# Patient Record
Sex: Female | Born: 1990 | Race: Black or African American | Hispanic: No | Marital: Single | Smoking: Former smoker
Health system: Southern US, Community
[De-identification: ages and names within clinical notes are randomized; demographics above are authoritative.]

## PROBLEM LIST (undated history)

## (undated) DIAGNOSIS — Z789 Other specified health status: Secondary | ICD-10-CM

## (undated) DIAGNOSIS — D649 Anemia, unspecified: Secondary | ICD-10-CM

## (undated) HISTORY — PX: NO PAST SURGERIES: SHX2092

## (undated) HISTORY — DX: Anemia, unspecified: D64.9

## (undated) HISTORY — PX: WISDOM TOOTH EXTRACTION: SHX21

---
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
## 2012-01-16 ENCOUNTER — Encounter (HOSPITAL_COMMUNITY): Payer: Self-pay | Admitting: *Deleted

## 2012-01-16 ENCOUNTER — Emergency Department (HOSPITAL_COMMUNITY)
Admission: EM | Admit: 2012-01-16 | Discharge: 2012-01-16 | Disposition: A | Discharge status: Home / Self Care | Payer: Self-pay | Attending: Emergency Medicine | Admitting: Emergency Medicine

## 2012-01-16 ENCOUNTER — Emergency Department (HOSPITAL_COMMUNITY): Payer: Self-pay

## 2012-01-16 DIAGNOSIS — Y9389 Activity, other specified: Secondary | ICD-10-CM | POA: Insufficient documentation

## 2012-01-16 DIAGNOSIS — S9030XA Contusion of unspecified foot, initial encounter: Secondary | ICD-10-CM | POA: Insufficient documentation

## 2012-01-16 DIAGNOSIS — IMO0002 Reserved for concepts with insufficient information to code with codable children: Secondary | ICD-10-CM | POA: Insufficient documentation

## 2012-01-16 DIAGNOSIS — Y92410 Unspecified street and highway as the place of occurrence of the external cause: Secondary | ICD-10-CM | POA: Insufficient documentation

## 2012-01-16 DIAGNOSIS — F17200 Nicotine dependence, unspecified, uncomplicated: Secondary | ICD-10-CM | POA: Insufficient documentation

## 2012-01-16 MED ORDER — TRAMADOL HCL 50 MG PO TABS: 50.0000 mg | ORAL_TABLET | Freq: Four times a day (QID) | ORAL | Status: DC | PRN
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
## 2012-01-16 NOTE — ED Notes (Signed)
Patient given discharge instructions, information, prescriptions, and diet order. Patient states that they adequately understand discharge information given and to return to ED if symptoms return or worsen.     
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
## 2012-01-17 NOTE — ED Provider Notes (Signed)
Medical screening examination/treatment/procedure(s) were performed by non-physician practitioner and as supervising physician I was immediately available for consultation/collaboration.    Lexxie Winberg R Jaselyn Nahm, MD 01/17/12 0006 

## 2012-09-19 IMAGING — CT CT ABD-PELV W/O CM
1 of 2 series · 15 of 32 positions shown, 19 images · non-contrast
Comparison: None.

CLINICAL DATA: Right flank pain and dizziness.

CT ABDOMEN AND PELVIS WITHOUT CONTRAST
TECHNIQUE: Multidetector CT imaging of the abdomen and pelvis was
performed following the standard protocol without intravenous
contrast.

[Series 2: stone 130 5.0 b31f st · axial · 0.54mm/px · z∈[-424,-34]mm · 15 of 86 slices shown, 19 images]
[im 4/86  soft-tissue]
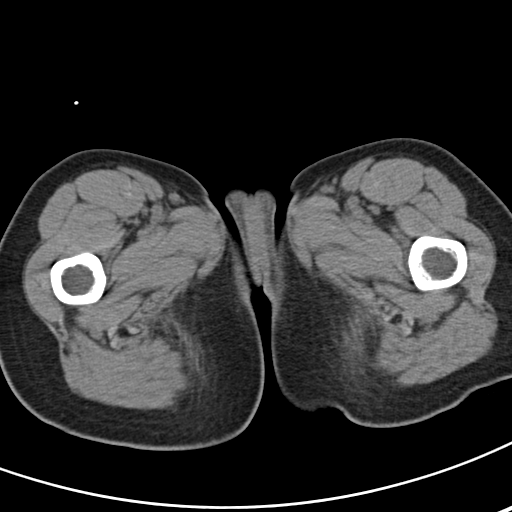
[im 4/86  bone]
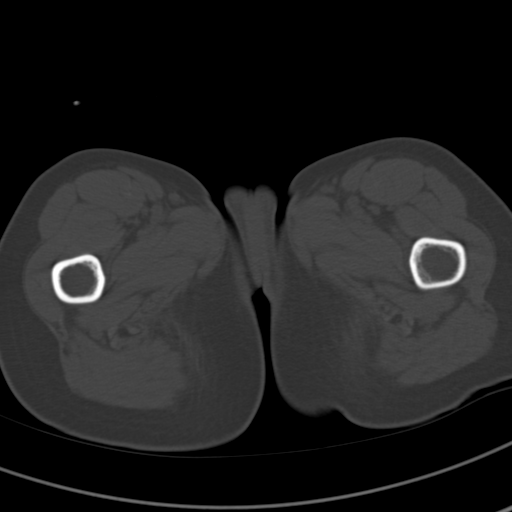
[im 11/86  soft-tissue]
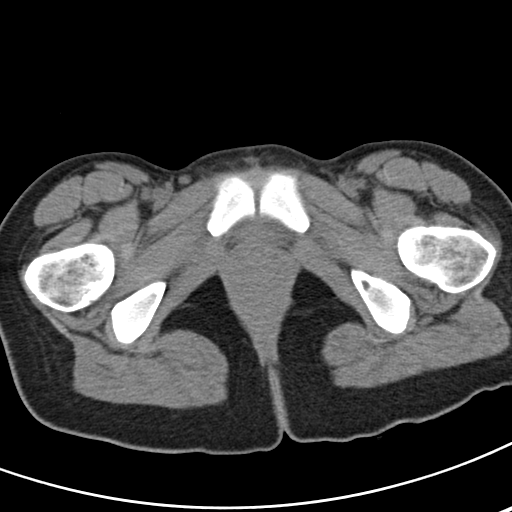
[im 18/86  soft-tissue]
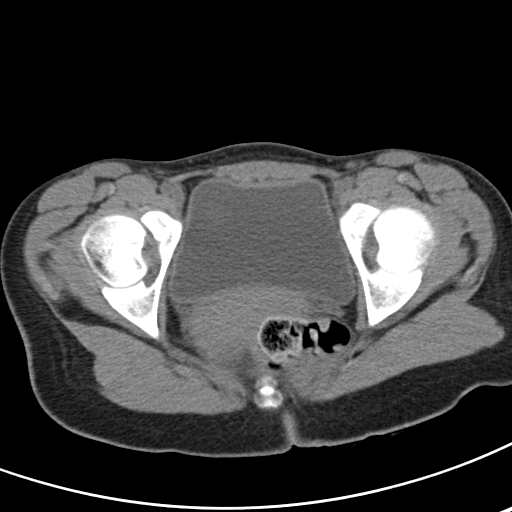
[im 24/86  soft-tissue]
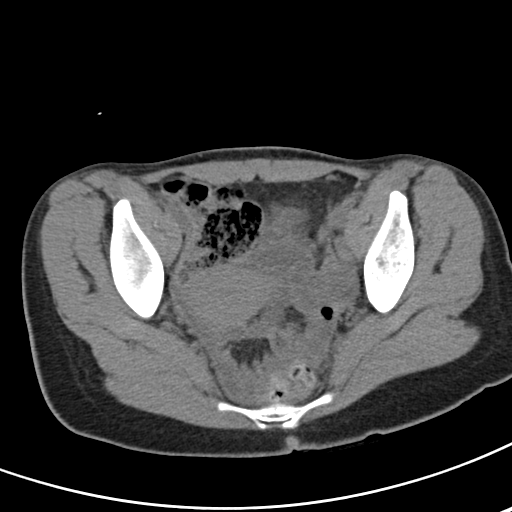
[im 31/86  soft-tissue]
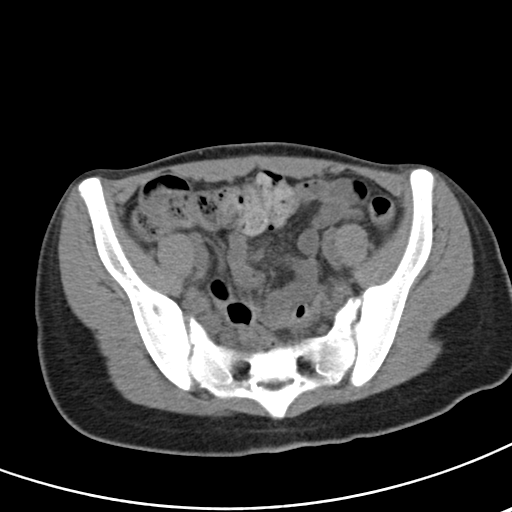
[im 38/86  soft-tissue]
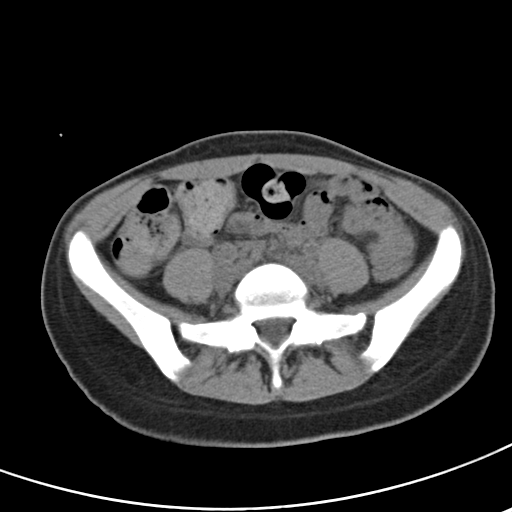
[im 45/86  soft-tissue]
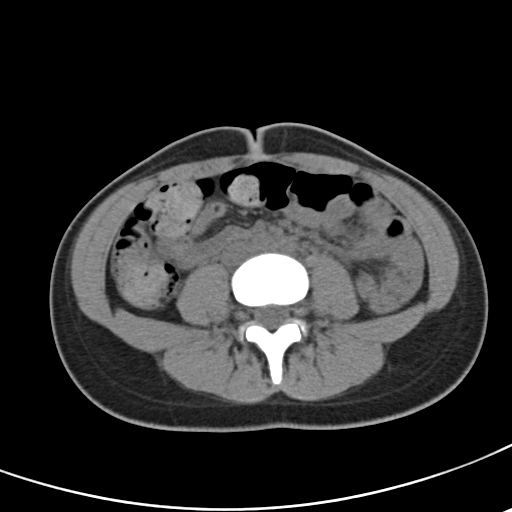
[im 48/86  soft-tissue]
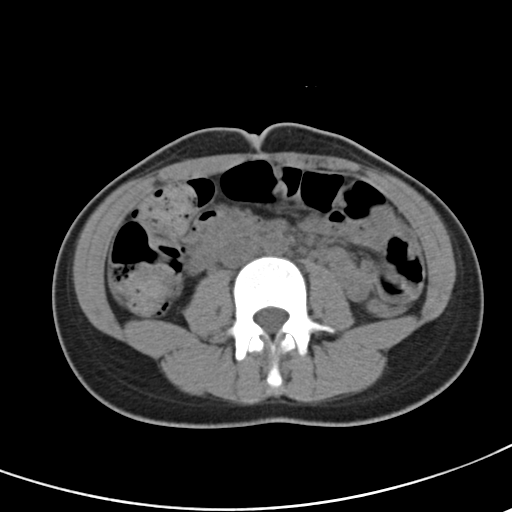
[im 55/86  soft-tissue]
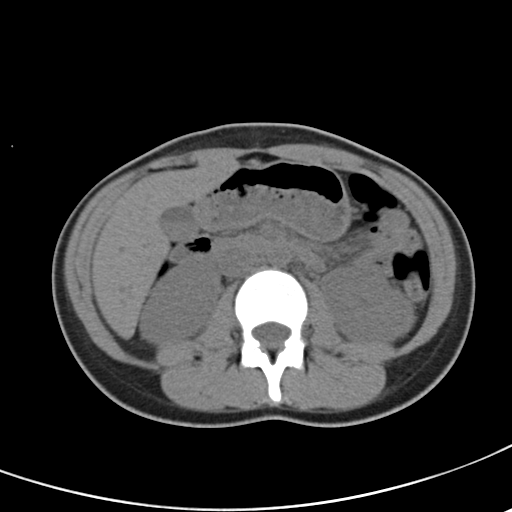
[im 55/86  bone]
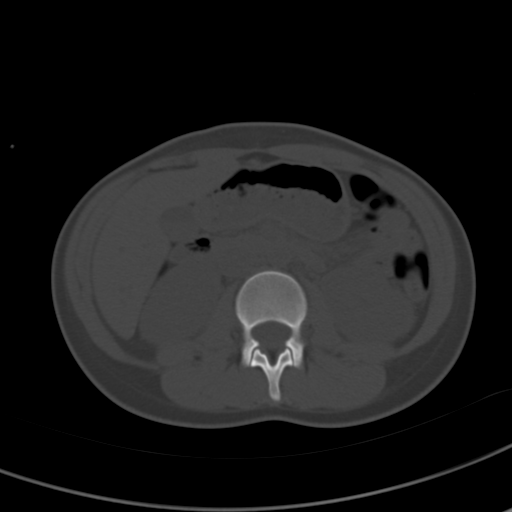
[im 62/86  soft-tissue]
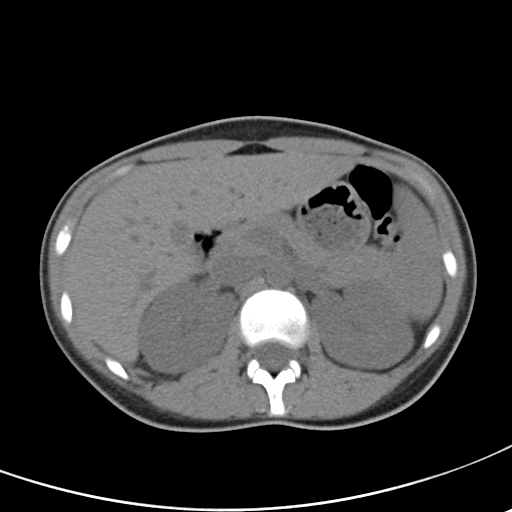
[im 69/86  soft-tissue]
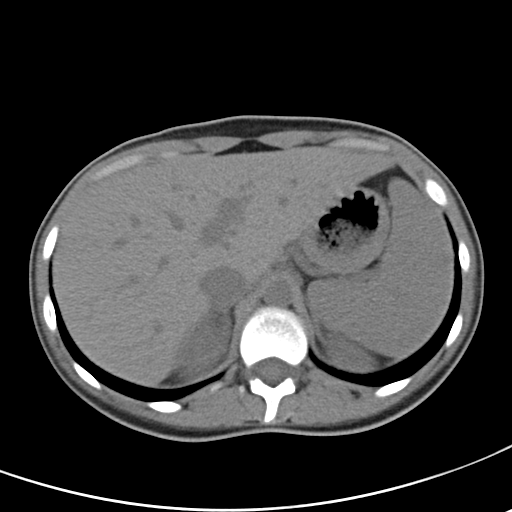
[im 72/86  lung]
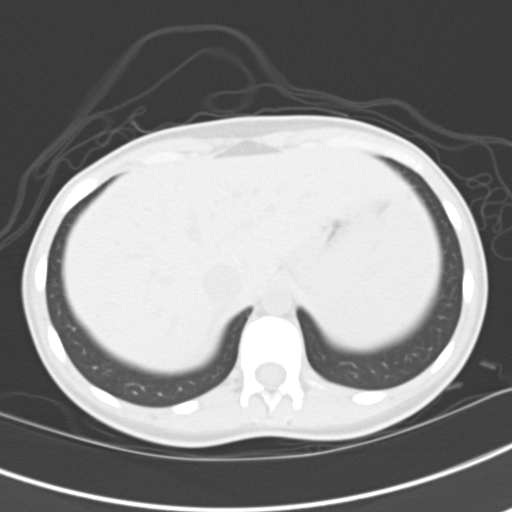
[im 75/86  soft-tissue]
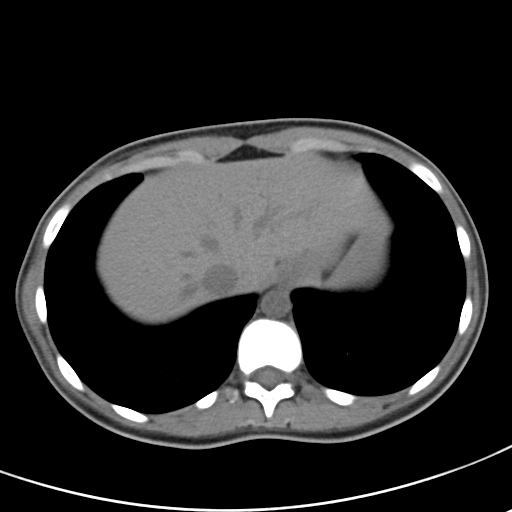
[im 75/86  lung]
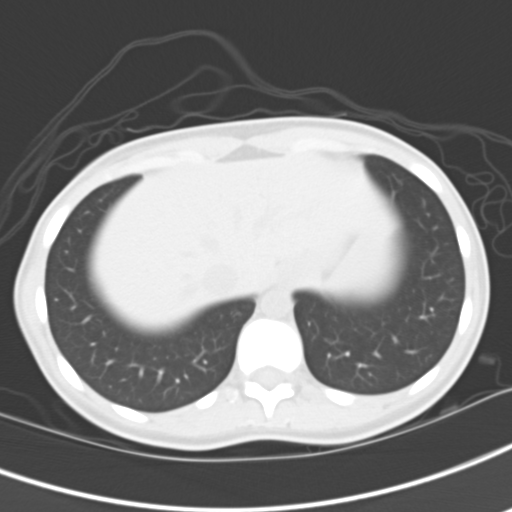
[im 79/86  lung]
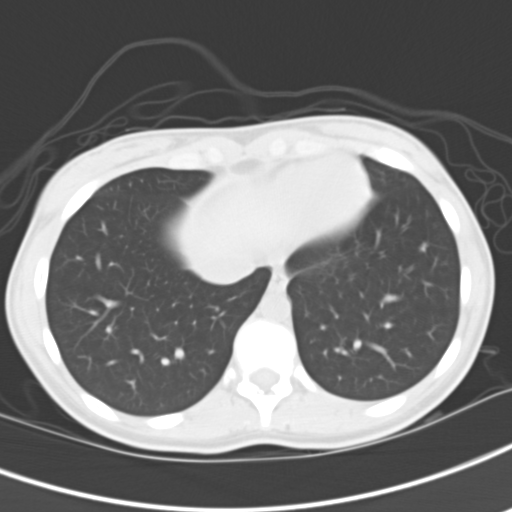
[im 82/86  soft-tissue]
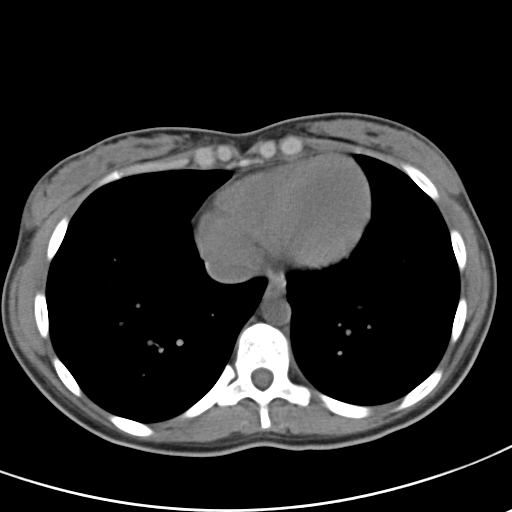
[im 82/86  lung]
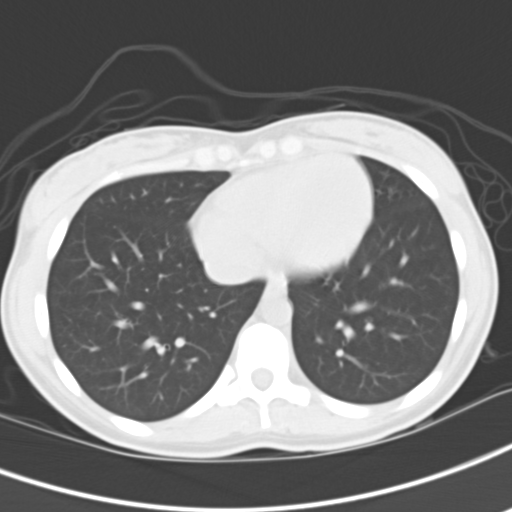

[15 of 32 positions shown; findings below may reference images not displayed]

FINDINGS: The visualized lung bases are clear.

The liver and spleen are unremarkable in appearance.  The
gallbladder is within normal limits.  The pancreas and adrenal
glands are unremarkable.

There is suggestion of a small 2 mm nonobstructing stone at the
interpole region of the right kidney.  There may also be a tiny
stone near the upper pole of the left kidney.  The kidneys are
otherwise unremarkable in appearance.  There is no evidence of
hydronephrosis.  No obstructing ureteral stones are seen.  No
perinephric stranding is appreciated.

No free fluid is identified.  The small bowel is unremarkable in
appearance.  The stomach is within normal limits.  No acute
vascular abnormalities are seen.

The appendix is grossly normal in caliber, though difficult to
fully characterize; there is no definite evidence for appendicitis.
The colon is unremarkable in appearance.  The sigmoid colon is
difficult to fully assess due to adjacent small bowel loops.

The bladder is mildly distended and grossly unremarkable in
appearance.  The uterus is within normal limits.  The ovaries are
relatively symmetric; no suspicious adnexal masses are seen.  No
inguinal lymphadenopathy is seen.

No acute osseous abnormalities are identified.
IMPRESSION: 1.  No obstructing ureteral stones seen; no evidence of
hydronephrosis.
2.  Likely tiny nonobstructing stones noted within both kidneys,
though these are not well characterized due to their size.

## 2013-03-02 ENCOUNTER — Other Ambulatory Visit (HOSPITAL_COMMUNITY)
Admission: RE | Admit: 2013-03-02 | Discharge: 2013-03-02 | Disposition: A | Discharge status: Home / Self Care | Payer: Self-pay | Source: Ambulatory Visit | Attending: Emergency Medicine | Admitting: Emergency Medicine

## 2013-03-02 ENCOUNTER — Encounter (HOSPITAL_COMMUNITY): Payer: Self-pay | Admitting: Emergency Medicine

## 2013-03-02 ENCOUNTER — Emergency Department (INDEPENDENT_AMBULATORY_CARE_PROVIDER_SITE_OTHER)
Admission: EM | Admit: 2013-03-02 | Discharge: 2013-03-02 | Disposition: A | Discharge status: Home / Self Care | Payer: Self-pay | Source: Home / Self Care

## 2013-03-02 DIAGNOSIS — N730 Acute parametritis and pelvic cellulitis: Secondary | ICD-10-CM

## 2013-03-02 DIAGNOSIS — Z113 Encounter for screening for infections with a predominantly sexual mode of transmission: Secondary | ICD-10-CM | POA: Insufficient documentation

## 2013-03-02 DIAGNOSIS — N760 Acute vaginitis: Secondary | ICD-10-CM

## 2013-03-02 DIAGNOSIS — N898 Other specified noninflammatory disorders of vagina: Secondary | ICD-10-CM

## 2013-03-02 LAB — POCT PREGNANCY, URINE: Preg Test, Ur: NEGATIVE

## 2013-03-02 LAB — POCT URINALYSIS DIP (DEVICE)
Bilirubin Urine: NEGATIVE
Glucose, UA: NEGATIVE mg/dL
Hgb urine dipstick: NEGATIVE
Ketones, ur: NEGATIVE mg/dL
Nitrite: NEGATIVE
Specific Gravity, Urine: 1.025 (ref 1.005–1.030)
pH: 6.5 (ref 5.0–8.0)

## 2013-03-02 MED ORDER — AZITHROMYCIN 250 MG PO TABS
ORAL_TABLET | ORAL | Status: AC
  Filled 2013-03-02: qty 4

## 2013-03-02 MED ORDER — METRONIDAZOLE 500 MG PO TABS: 500.0000 mg | ORAL_TABLET | Freq: Two times a day (BID) | ORAL | Status: DC

## 2013-03-02 MED ORDER — AZITHROMYCIN 250 MG PO TABS
1000.0000 mg | ORAL_TABLET | Freq: Every day | ORAL | Status: DC
  Administered 2013-03-02 (×2): 1000 mg via ORAL

## 2013-03-02 NOTE — ED Provider Notes (Signed)
Medical screening examination/treatment/procedure(s) were performed by non-physician practitioner and as supervising physician I was immediately available for consultation/collaboration.  Leslee Home, M.D.   Reuben Likes, MD 03/02/13 [phone]

## 2013-03-02 NOTE — ED Notes (Signed)
Vaginal pain, onset 3 days ago, reports clear vaginal discharge.  irregular periods.  Stinging pain, intermittent.

## 2013-03-02 NOTE — ED Provider Notes (Signed)
CSN: [id number]     Arrival date & time 03/02/13  0802 History   First MD Initiated Contact with Patient 03/02/13 [phone]     Chief Complaint  Patient presents with  . Vaginal Pain   (Consider location/radiation/quality/duration/timing/severity/associated sxs/prior Treatment) HPI Comments: 22 year old female presents with vaginal pain for 3 days. Approximately 2 days prior to that she developed pelvic pain. She states she has a small amount of clear vaginal discharge. She is sexually active with last intercourse one week ago. Denies fever, abdominal pain, nausea, vomiting or other GI symptoms. Denies urinary frequency or dysuria however after that duration she feels mid lower pressure. Her LMP was the first week of November in which she had an essentially normal flow for one day and then spotting the second day, after which her flow stopped.   Past Medical History  Diagnosis Date  . No pertinent past medical history    Past Surgical History  Procedure Laterality Date  . No past surgeries     Family History  Problem Relation Age of Onset  . Diabetes Father   . Diabetes Sister   . Cancer Maternal Grandmother   . Diabetes Maternal Grandmother    History  Substance Use Topics  . Smoking status: Current Some Day Smoker  . Smokeless tobacco: Not on file  . Alcohol Use: Yes     Comment: social   OB History   Grav Para Term Preterm Abortions TAB SAB Ect Mult Living   0              Review of Systems  Constitutional: Negative.   HENT: Negative.   Cardiovascular: Negative.   Gastrointestinal: Negative.   Genitourinary: Positive for vaginal discharge, vaginal pain and pelvic pain. Negative for dysuria, frequency and flank pain.  Skin: Negative.   Neurological: Negative.     Allergies  Review of patient's allergies indicates no known allergies.  Home Medications   Current Outpatient Rx  Name  Route  Sig  Dispense  Refill  . metroNIDAZOLE (FLAGYL) 500 MG tablet   Oral  Take 1 tablet (500 mg total) by mouth 2 (two) times daily. X 7 days   14 tablet   0   . traMADol (ULTRAM) 50 MG tablet   Oral   Take 1 tablet (50 mg total) by mouth every 6 (six) hours as needed for pain.   15 tablet   0    BP 126/69  Pulse 109  Temp(Src) 97.8 F (36.6 C) (Oral)  Resp 16  SpO2 100% Physical Exam  Nursing note and vitals reviewed. Constitutional: She is oriented to person, place, and time. She appears well-developed and well-nourished. No distress.  Neck: Normal range of motion. Neck supple.  Cardiovascular: Normal rate, regular rhythm and normal heart sounds.   Pulmonary/Chest: Effort normal and breath sounds normal. No respiratory distress. She has no wheezes.  Abdominal: Soft. Bowel sounds are normal. She exhibits no distension and no mass. There is no tenderness. There is no rebound and no guarding.  Genitourinary:  Tenderness across the lower pelvis.  Selena Batten ,RN chaperone) External female genitalia normal There is a slight creamy bubbly discharge pitting the vagina and within the vaginal vault. Cervix is right of midline with red macules. Bimanual: Moderate to severe CMT, right and left adnexal tenderness.  Musculoskeletal: She exhibits no edema and no tenderness.  Neurological: She is alert and oriented to person, place, and time. She exhibits normal muscle tone.  Skin: Skin is warm.  Psychiatric:  She has a normal mood and affect.    ED Course  Procedures (including critical care time) Labs Review Labs Reviewed  POCT URINALYSIS DIP (DEVICE) - Abnormal; Notable for the following:    Leukocytes, UA SMALL (*)    All other components within normal limits  POCT PREGNANCY, URINE  CERVICOVAGINAL ANCILLARY ONLY   Imaging Review No results found.    MDM   1. PID (acute pelvic inflammatory disease)   2. Vaginitis   3. Vaginal discharge     The pelvic exam findings were reviewed with the patient. She has symptoms for vaginitis and PID. The treatment  to include Rocephin injection as well as tablets were discussed. She emphatically refuses to have an injection of any medicine. Consequences of not being treated were discussed as well as requirements for treatment by the health department if her GC is positive. Azithromycin 1 g by mouth now Flagyl 500 mg twice a day for 7 days.      Hayden Rasmussen, NP 03/02/13 [phone]

## 2013-03-02 NOTE — ED Notes (Addendum)
Sherren Mocha, NP informed patient of medications, neccessity of medications and the routine and involvement of health department if tests are positive and patient not treated today.  Patient aware the department may call her back for treatment.  Patient unwilling to received injection unless absolutely needed.

## 2013-03-09 NOTE — ED Notes (Signed)
GC neg., Chlamydia pos., Affirm: Candida and Gardnerella neg., Trich pos.  Pt. adequately treated with Zithromax and Flagyl.  Pt. needs notified. Samantha Randall 03/09/2013

## 2013-03-10 ENCOUNTER — Telehealth (HOSPITAL_COMMUNITY): Payer: Self-pay | Admitting: *Deleted

## 2013-03-10 NOTE — ED Notes (Signed)
I called pt. Pt. verified x 2 and given results.  Pt. told he was adequately treated with Zithromax for Chlamydia and Flagyl for Trich.  Pt. instructed to notify her partner to be treated for both, no sex until she finishes her Flagyl and her partner has been treated and to practice safe sex. Pt. told she can get HIV testing at the [facility]. STD clinic, by appointment.  Pt. said her partner was tested and he told her he was negative for everything. I told her, to tell him he needs to talk to his doctor. He may need to be retested. DHHS form completed and faxed to the [facility]. [name] 03/10/2013

## 2016-09-15 DIAGNOSIS — Z79899 Other long term (current) drug therapy: Secondary | ICD-10-CM | POA: Insufficient documentation

## 2016-09-15 DIAGNOSIS — F17200 Nicotine dependence, unspecified, uncomplicated: Secondary | ICD-10-CM | POA: Insufficient documentation

## 2016-09-15 DIAGNOSIS — O9989 Other specified diseases and conditions complicating pregnancy, childbirth and the puerperium: Secondary | ICD-10-CM | POA: Insufficient documentation

## 2016-09-15 DIAGNOSIS — Z3A01 Less than 8 weeks gestation of pregnancy: Secondary | ICD-10-CM | POA: Insufficient documentation

## 2016-09-15 DIAGNOSIS — N390 Urinary tract infection, site not specified: Secondary | ICD-10-CM | POA: Insufficient documentation

## 2016-09-15 LAB — COMPREHENSIVE METABOLIC PANEL
ALBUMIN: 5 g/dL (ref 3.5–5.0)
ALK PHOS: 65 U/L (ref 38–126)
ALT: 11 U/L — ABNORMAL LOW (ref 14–54)
AST: 18 U/L (ref 15–41)
Anion gap: 10 (ref 5–15)
BILIRUBIN TOTAL: 0.4 mg/dL (ref 0.3–1.2)
BUN: 10 mg/dL (ref 6–20)
CALCIUM: 9.7 mg/dL (ref 8.9–10.3)
CO2: 21 mmol/L — ABNORMAL LOW (ref 22–32)
CREATININE: 0.58 mg/dL (ref 0.44–1.00)
Chloride: 106 mmol/L (ref 101–111)
GFR calc Af Amer: >60 mL/min (ref >60)
GLUCOSE: 79 mg/dL (ref 65–99)
POTASSIUM: 3.4 mmol/L — AB (ref 3.5–5.1)
Sodium: 137 mmol/L (ref 135–145)
TOTAL PROTEIN: 8.9 g/dL — AB (ref 6.5–8.1)

## 2016-09-15 LAB — URINALYSIS, COMPLETE (UACMP) WITH MICROSCOPIC
Bilirubin Urine: NEGATIVE
Glucose, UA: NEGATIVE mg/dL
HGB URINE DIPSTICK: NEGATIVE
Ketones, ur: 80 mg/dL — AB
NITRITE: POSITIVE — AB
PH: 6 (ref 5.0–8.0)
Protein, ur: 100 mg/dL — AB
Specific Gravity, Urine: 1.029 (ref 1.005–1.030)

## 2016-09-15 LAB — CBC
HEMATOCRIT: 33.5 % — AB (ref 35.0–47.0)
Hemoglobin: 10.5 g/dL — ABNORMAL LOW (ref 12.0–16.0)
MCH: 20.9 pg — ABNORMAL LOW (ref 26.0–34.0)
MCHC: 31.3 g/dL — AB (ref 32.0–36.0)
MCV: 66.6 fL — ABNORMAL LOW (ref 80.0–100.0)
PLATELETS: 259 10*3/uL (ref 150–440)
RBC: 5.04 MIL/uL (ref 3.80–5.20)
RDW: 20.3 % — ABNORMAL HIGH (ref 11.5–14.5)
WBC: 9.7 10*3/uL (ref 3.6–11.0)

## 2016-09-15 LAB — LIPASE, BLOOD: Lipase: 27 U/L (ref 11–51)

## 2016-09-15 NOTE — ED Triage Notes (Signed)
Patient ambulatory to triage with steady gait, without difficulty or distress noted; pt reports nausea and lower abd pain x month

## 2016-09-16 ENCOUNTER — Emergency Department
Admission: EM | Admit: 2016-09-16 | Discharge: 2016-09-16 | Disposition: A | Discharge status: Home / Self Care | Payer: Self-pay | Attending: Emergency Medicine | Admitting: Emergency Medicine

## 2016-09-16 DIAGNOSIS — Z3491 Encounter for supervision of normal pregnancy, unspecified, first trimester: Secondary | ICD-10-CM

## 2016-09-16 DIAGNOSIS — N390 Urinary tract infection, site not specified: Secondary | ICD-10-CM

## 2016-09-16 DIAGNOSIS — R1030 Lower abdominal pain, unspecified: Secondary | ICD-10-CM

## 2016-09-16 LAB — PREGNANCY, URINE: PREG TEST UR: POSITIVE — AB

## 2016-09-16 MED ORDER — ONDANSETRON 4 MG PO TBDP
4.0000 mg | ORAL_TABLET | Freq: Once | ORAL | Status: AC
  Administered 2016-09-16: 4 mg via ORAL
  Filled 2016-09-16: qty 1

## 2016-09-16 MED ORDER — CEPHALEXIN 500 MG PO CAPS
500.0000 mg | ORAL_CAPSULE | Freq: Once | ORAL | Status: AC
Start: 2016-09-16 — End: 2016-09-16
  Administered 2016-09-16: 500 mg via ORAL
  Filled 2016-09-16: qty 1

## 2016-09-16 MED ORDER — ONDANSETRON 4 MG PO TBDP: 4.0000 mg | ORAL_TABLET | Freq: Three times a day (TID) | ORAL | 0 refills | Status: DC | PRN

## 2016-09-16 MED ORDER — CEPHALEXIN 500 MG PO CAPS: 500.0000 mg | ORAL_CAPSULE | Freq: Three times a day (TID) | ORAL | 0 refills | Status: DC

## 2016-09-16 NOTE — Discharge Instructions (Signed)
1. Take antibiotic as prescribed (Keflex 500 mg 3 times daily 7 days). 2. You may take Zofran as needed for nausea. 3. Return to the ER for worsening symptoms, persistent vomiting, difficulty breathing or other concerns.

## 2016-09-16 NOTE — ED Provider Notes (Signed)
[facility] Emergency Department Provider Note   ____________________________________________   First MD Initiated Contact with Patient 09/16/16 [phone]     (approximate)  I have reviewed the triage vital signs and the nursing notes.   HISTORY  Chief Complaint Abdominal Pain    HPI Samantha Randall is a 26 y.o. female who presents to the ED from home with a chief complaint of nausea and lower abdominal pain. Patient reports symptoms ongoing for 1 month. Last menstrual period was 6 weeks ago and thinks she might pregnant. Also complains of dysuria that she may have a UTI. Denies associated fever, chills, chest pain, shortness of breath, vomiting, diarrhea.Denies vaginal discharge or concerns for STDs. Denies recent travel or trauma. Nothing makes her symptoms better or worse.   Past Medical History:  Diagnosis Date  . No pertinent past medical history     There are no active problems to display for this patient.   Past Surgical History:  Procedure Laterality Date  . NO PAST SURGERIES      Prior to Admission medications   Medication Sig Start Date End Date Taking? Authorizing Provider  cephALEXin (KEFLEX) 500 MG capsule Take 1 capsule (500 mg total) by mouth 3 (three) times daily. 09/16/16   Irean Hong, MD  metroNIDAZOLE (FLAGYL) 500 MG tablet Take 1 tablet (500 mg total) by mouth 2 (two) times daily. X 7 days 03/02/13   Hayden Rasmussen, NP  ondansetron (ZOFRAN ODT) 4 MG disintegrating tablet Take 1 tablet (4 mg total) by mouth every 8 (eight) hours as needed for nausea or vomiting. 09/16/16   Irean Hong, MD  traMADol (ULTRAM) 50 MG tablet Take 1 tablet (50 mg total) by mouth every 6 (six) hours as needed for pain. 01/16/12   Pisciotta, Joni Reining, PA-C    Allergies Patient has no known allergies.  Family History  Problem Relation Age of Onset  . Diabetes Father   . Diabetes Sister   . Cancer Maternal Grandmother   . Diabetes Maternal Grandmother      Social History Social History  Substance Use Topics  . Smoking status: Current Some Day Smoker  . Smokeless tobacco: Not on file  . Alcohol use Yes     Comment: social    Review of Systems  Constitutional: No fever/chills. Eyes: No visual changes. ENT: No sore throat. Cardiovascular: Denies chest pain. Respiratory: Denies shortness of breath. Gastrointestinal: Positive for low abdominal pain and nausea, no vomiting.  No diarrhea.  No constipation. Genitourinary: Negative for dysuria. Musculoskeletal: Negative for back pain. Skin: Negative for rash. Neurological: Negative for headaches, focal weakness or numbness.   ____________________________________________   PHYSICAL EXAM:  VITAL SIGNS: ED Triage Vitals  Enc Vitals Group     BP 09/15/16 2147 120/62     Pulse Rate 09/15/16 2147 65     Resp 09/15/16 2147 18     Temp 09/15/16 2147 98.1 F (36.7 C)     Temp Source 09/15/16 2147 Oral     SpO2 09/15/16 2147 100 %     Weight 09/15/16 2146 95 lb (43.1 kg)     Height 09/15/16 2146 5\' 2"  (1.575 m)     Head Circumference --      Peak Flow --      Pain Score 09/15/16 2145 3     Pain Loc --      Pain Edu? --      Excl. in GC? --     Constitutional: Alert and  oriented. Well appearing and in no acute distress. Eyes: Conjunctivae are normal. PERRL. EOMI. Head: Atraumatic. Nose: No congestion/rhinnorhea. Mouth/Throat: Mucous membranes are moist.  Oropharynx non-erythematous. Neck: No stridor.   Cardiovascular: Normal rate, regular rhythm. Grossly normal heart sounds.  Good peripheral circulation. Respiratory: Normal respiratory effort.  No retractions. Lungs CTAB. Gastrointestinal: Soft and mildly tender to palpation suprapubic area without rebound or guarding. No distention. No abdominal bruits. No CVA tenderness. Musculoskeletal: No lower extremity tenderness nor edema.  No joint effusions. Neurologic:  Normal speech and language. No gross focal neurologic deficits  are appreciated. No gait instability. Skin:  Skin is warm, dry and intact. No rash noted. Psychiatric: Mood and affect are normal. Speech and behavior are normal.  ____________________________________________   LABS (all labs ordered are listed, but only abnormal results are displayed)  Labs Reviewed  COMPREHENSIVE METABOLIC PANEL - Abnormal; Notable for the following:       Result Value   Potassium 3.4 (*)    CO2 21 (*)    Total Protein 8.9 (*)    ALT 11 (*)    All other components within normal limits  CBC - Abnormal; Notable for the following:    Hemoglobin 10.5 (*)    HCT 33.5 (*)    MCV 66.6 (*)    MCH 20.9 (*)    MCHC 31.3 (*)    RDW 20.3 (*)    All other components within normal limits  URINALYSIS, COMPLETE (UACMP) WITH MICROSCOPIC - Abnormal; Notable for the following:    Color, Urine YELLOW (*)    APPearance HAZY (*)    Ketones, ur 80 (*)    Protein, ur 100 (*)    Nitrite POSITIVE (*)    Leukocytes, UA SMALL (*)    Bacteria, UA MANY (*)    Squamous Epithelial / LPF 6-30 (*)    All other components within normal limits  PREGNANCY, URINE - Abnormal; Notable for the following:    Preg Test, Ur POSITIVE (*)    All other components within normal limits  LIPASE, BLOOD  POC URINE PREG, ED   ____________________________________________  EKG  None ____________________________________________  RADIOLOGY  No results found.  ____________________________________________   PROCEDURES  Procedure(s) performed: None  Procedures  Critical Care performed: No  ____________________________________________   INITIAL IMPRESSION / ASSESSMENT AND PLAN / ED COURSE  Pertinent labs & imaging results that were available during my care of the patient were reviewed by me and considered in my medical decision making (see chart for details).  26 year old female who presents with suprapubic abdominal pain and nausea for the past month. Laboratory and urinalysis results  remarkable for UTI. Will check pregnancy; start antibiotics.  Clinical Course as of Sep 16 198  Wed Sep 16, 2016  0156 Updated patient of positive urine pregnancy test. She declines to have ultrasound at this time secondary to having to leave with her ride. Will refer her to local OB/GYN for follow-up. Strict return precautions given. Patient verbalizes understanding and agrees with plan of care.  [JS]    Clinical Course User Index [JS] Irean HongSung, Albaraa Swingle J, MD     ____________________________________________   FINAL CLINICAL IMPRESSION(S) / ED DIAGNOSES  Final diagnoses:  Lower urinary tract infectious disease  Lower abdominal pain  First trimester pregnancy      NEW MEDICATIONS STARTED DURING THIS VISIT:  New Prescriptions   CEPHALEXIN (KEFLEX) 500 MG CAPSULE    Take 1 capsule (500 mg total) by mouth 3 (three) times daily.  ONDANSETRON (ZOFRAN ODT) 4 MG DISINTEGRATING TABLET    Take 1 tablet (4 mg total) by mouth every 8 (eight) hours as needed for nausea or vomiting.     Note:  This document was prepared using Dragon voice recognition software and may include unintentional dictation errors.    Irean Hong, MD 09/16/16 [phone]

## 2016-10-23 ENCOUNTER — Encounter: Payer: Self-pay | Admitting: Obstetrics and Gynecology

## 2016-10-23 ENCOUNTER — Ambulatory Visit (INDEPENDENT_AMBULATORY_CARE_PROVIDER_SITE_OTHER): Payer: Self-pay | Admitting: Obstetrics and Gynecology

## 2016-10-23 VITALS — BP 115/68 | HR 79 | Ht 62.0 in | Wt 98.6 lb

## 2016-10-23 DIAGNOSIS — N3000 Acute cystitis without hematuria: Secondary | ICD-10-CM

## 2016-10-23 NOTE — Progress Notes (Signed)
HPI:      Samantha Randall is a 26 y.o. G2P1001 who LMP was Patient's last menstrual period was 07/28/2016 (exact date).  Subjective:   She presents today After being seen in the emergency department and diagnosed with a UTI. She has begun antibiotics and says that her pain is gone. She states that based on her last menstrual period she is approximately [redacted] weeks pregnant. She is taking prenatal vitamins. Her last pregnancy resulted in a vaginal term birth without complications.    Hx: The following portions of the patient's history were reviewed and updated as appropriate:             She  has a past medical history of Anemia and No pertinent past medical history. She  does not have a problem list on file. She  has a past surgical history that includes No past surgeries. Her family history includes Cancer in her maternal grandmother; Diabetes in her father, maternal grandmother, and sister. She  reports that she has quit smoking. She has never used smokeless tobacco. She reports that she does not drink alcohol or use drugs. She is allergic to ibuprofen.       Review of Systems:  Review of Systems  Constitutional: Denied constitutional symptoms, night sweats, recent illness, fatigue, fever, insomnia and weight loss.  Eyes: Denied eye symptoms, eye pain, photophobia, vision change and visual disturbance.  Ears/Nose/Throat/Neck: Denied ear, nose, throat or neck symptoms, hearing loss, nasal discharge, sinus congestion and sore throat.  Cardiovascular: Denied cardiovascular symptoms, arrhythmia, chest pain/pressure, edema, exercise intolerance, orthopnea and palpitations.  Respiratory: Denied pulmonary symptoms, asthma, pleuritic pain, productive sputum, cough, dyspnea and wheezing.  Gastrointestinal: Denied, gastro-esophageal reflux, melena, nausea and vomiting.  Genitourinary: Denied genitourinary symptoms including symptomatic vaginal discharge, pelvic relaxation issues, and urinary  complaints.  Musculoskeletal: Denied musculoskeletal symptoms, stiffness, swelling, muscle weakness and myalgia.  Dermatologic: Denied dermatology symptoms, rash and scar.  Neurologic: Denied neurology symptoms, dizziness, headache, neck pain and syncope.  Psychiatric: Denied psychiatric symptoms, anxiety and depression.  Endocrine: Denied endocrine symptoms including hot flashes and night sweats.   Meds:   Current Outpatient Prescriptions on File Prior to Visit  Medication Sig Dispense Refill  . cephALEXin (KEFLEX) 500 MG capsule Take 1 capsule (500 mg total) by mouth 3 (three) times daily. 21 capsule 0  . metroNIDAZOLE (FLAGYL) 500 MG tablet Take 1 tablet (500 mg total) by mouth 2 (two) times daily. X 7 days (Patient not taking: Reported on 10/23/2016) 14 tablet 0  . ondansetron (ZOFRAN ODT) 4 MG disintegrating tablet Take 1 tablet (4 mg total) by mouth every 8 (eight) hours as needed for nausea or vomiting. (Patient not taking: Reported on 10/23/2016) 20 tablet 0  . traMADol (ULTRAM) 50 MG tablet Take 1 tablet (50 mg total) by mouth every 6 (six) hours as needed for pain. (Patient not taking: Reported on 10/23/2016) 15 tablet 0   No current facility-administered medications on file prior to visit.     Objective:     Vitals:   10/23/16 0920  BP: 115/68  Pulse: 79    Abdominal examination reveals that the uterus is not palpable above the symphysis pubis.            Assessment:    G2P1001 There are no active problems to display for this patient.    1. Acute cystitis without hematuria     Patient has begun antibiotics and feels much better.   Plan:  1.  Return for new OB visit, physical exam, ultrasound scheduling. Orders No orders of the defined types were placed in this encounter.    Meds ordered this encounter  Medications  . Prenatal Vit-Fe Fumarate-FA (PRENATAL MULTIVITAMIN) TABS tablet    Sig: Take 1 tablet by mouth daily at 12 noon.  . IRON PO    Sig:  Take by mouth.  . DISCONTD: cephALEXin (KEFLEX) 500 MG capsule    Sig: Take 500 mg by mouth.        F/U  Return in about 2 weeks (around 11/06/2016).  Elonda Huskyavid J. Shamaine Mulkern, M.D. 10/23/2016 10:04 AM

## 2016-11-10 ENCOUNTER — Encounter: Payer: Self-pay | Admitting: Obstetrics and Gynecology

## 2016-11-10 ENCOUNTER — Ambulatory Visit (INDEPENDENT_AMBULATORY_CARE_PROVIDER_SITE_OTHER): Payer: Medicaid Other | Admitting: Obstetrics and Gynecology

## 2016-11-10 VITALS — BP 107/65 | HR 96 | Wt 100.2 lb

## 2016-11-10 DIAGNOSIS — Z3482 Encounter for supervision of other normal pregnancy, second trimester: Secondary | ICD-10-CM | POA: Diagnosis not present

## 2016-11-10 NOTE — Progress Notes (Signed)
NOB: HPI:      Samantha Randall is a 26 y.o. G2P1001 who LMP was Patient's last menstrual period was 07/28/2016 (exact date).  Subjective:   She presents today At approximate [redacted] weeks pregnant by last menstrual period. She is eating and taking vitamins without issue. 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 She reports that she recently got over a cold sore and was wondering whether she should begin prophylactic Valtrex now. Her last pregnancy ended in a vaginal birth with vacuum assist 6 lbs. 2 oz. baby.    Hx: The following portions of the patient's history were reviewed and updated as appropriate:             She  has a past medical history of Anemia and No pertinent past medical history. She  does not have a problem list on file. She  has a past surgical history that includes No past surgeries. Her family history includes Cancer in her maternal grandmother; Diabetes in her father, maternal grandmother, and sister. She  reports that she has quit smoking. She has never used smokeless tobacco. She reports that she does not drink alcohol or use drugs. She is allergic to ibuprofen.       Review of Systems:  Review of Systems  Constitutional: Denied constitutional symptoms, night sweats, recent illness, fatigue, fever, insomnia and weight loss.  Eyes: Denied eye symptoms, eye pain, photophobia, vision change and visual disturbance.  Ears/Nose/Throat/Neck: Denied ear, nose, throat or neck symptoms, hearing loss, nasal discharge, sinus congestion and sore throat.  Cardiovascular: Denied cardiovascular symptoms, arrhythmia, chest pain/pressure, edema, exercise intolerance, orthopnea and palpitations.  Respiratory: Denied pulmonary symptoms, asthma, pleuritic pain, productive sputum, cough, dyspnea and wheezing.  Gastrointestinal: Denied, gastro-esophageal reflux, melena, nausea and vomiting.  Genitourinary: Denied  genitourinary symptoms including symptomatic vaginal discharge, pelvic relaxation issues, and urinary complaints.  Musculoskeletal: Denied musculoskeletal symptoms, stiffness, swelling, muscle weakness and myalgia.  Dermatologic: Denied dermatology symptoms, rash and scar.  Neurologic: Denied neurology symptoms, dizziness, headache, neck pain and syncope.  Psychiatric: Denied psychiatric symptoms, anxiety and depression.  Endocrine: Denied endocrine symptoms including hot flashes and night sweats.   Meds:   Current Outpatient Prescriptions on File Prior to Visit  Medication Sig Dispense Refill  . Prenatal Vit-Fe Fumarate-FA (PRENATAL MULTIVITAMIN) TABS tablet Take 1 tablet by mouth daily at 12 noon.    . cephALEXin (KEFLEX) 500 MG capsule Take 1 capsule (500 mg total) by mouth 3 (three) times daily. (Patient not taking: Reported on 11/10/2016) 21 capsule 0  . IRON PO Take by mouth.    . metroNIDAZOLE (FLAGYL) 500 MG tablet Take 1 tablet (500 mg total) by mouth 2 (two) times daily. X 7 days (Patient not taking: Reported on 10/23/2016) 14 tablet 0  . ondansetron (ZOFRAN ODT) 4 MG disintegrating tablet Take 1 tablet (4 mg total) by mouth every 8 (eight) hours as needed for nausea or vomiting. (Patient not taking: Reported on 10/23/2016) 20 tablet 0  . traMADol (ULTRAM) 50 MG tablet Take 1 tablet (50 mg total) by mouth every 6 (six) hours as needed for pain. (Patient not taking: Reported on 10/23/2016) 15 tablet 0   No current facility-administered medications on file prior to visit.     Objective:     Vitals:   11/10/16 1414  BP: 107/65  Pulse: 96              Physical examination  General NAD, Conversant  HEENT Atraumatic; Op clear with mmm.  Normo-cephalic. Pupils reactive. Anicteric sclerae  Thyroid/Neck Smooth without nodularity or enlargement. Normal ROM.  Neck Supple.  Skin No rashes, lesions or ulceration. Normal palpated skin turgor. No nodularity.  Breasts: No masses or discharge.   Symmetric.  No axillary adenopathy.  Lungs: Clear to auscultation.No rales or wheezes. Normal Respiratory effort, no retractions.  Heart: NSR.  No murmurs or rubs appreciated. No periferal edema  Abdomen: Soft.  Non-tender.  No masses.  No HSM. No hernia  Extremities: Moves all appropriately.  Normal ROM for age. No lymphadenopathy.  Neuro: Oriented to PPT.  Normal mood. Normal affect.     Pelvic:   Vulva: Normal appearance.  No lesions.  Vagina: No lesions or abnormalities noted.  Support: Normal pelvic support.  Urethra No masses tenderness or scarring.  Meatus Normal size without lesions or prolapse.  Cervix: Not seen  Anus: Normal exam.  No lesions.  Perineum: Normal exam.  No lesions.        Bimanual   Adnexae: No masses.  Non-tender to palpation.  Uterus: Enlarged. 15 wks  Non-tender.  Mobile.  AV.  Adnexae: No masses.  Non-tender to palpation.  Cul-de-sac: Negative for abnormality.  Adnexae: No masses.  Non-tender to palpation.         Pelvimetry   Diagonal: Reached.  Spines: Average.  Sacrum: Concave.  Pubic Arch: Normal.   Patient not able to tolerate small speculum examination. Cervix not visualized. Pelvic exam discontinued before completion.   Assessment:    G2P1001 There are no active problems to display for this patient.    1. Encounter for supervision of normal first pregnancy in second trimester     Prenatal Plan 1.  The patient was given prenatal literature. 2.  She was continued on prenatal vitamins. 3.  A prenatal lab panel was ordered or drawn. 4.  An ultrasound was ordered to better determine an EDC. 5.  A nurse visit was scheduled. 6.  Pap GC/CT performed with pelvic exam.      Plan:              Orders No orders of the defined types were placed in this encounter.   No orders of the defined types were placed in this encounter.       F/U  Return in about 4 weeks (around 12/08/2016).  Elonda Husky, M.D. 11/10/2016 3:09 PM

## 2016-11-12 ENCOUNTER — Telehealth: Payer: Self-pay

## 2016-11-12 LAB — PAP IG, CT-NG, RFX HPV ASCU
CHLAMYDIA, NUC. ACID AMP: NEGATIVE
GONOCOCCUS BY NUCLEIC ACID AMP: NEGATIVE
PAP SMEAR COMMENT: 0

## 2016-11-12 NOTE — Telephone Encounter (Signed)
Message left for pt to return call

## 2016-11-12 NOTE — Telephone Encounter (Signed)
-----   Message from Linzie Collin, MD sent at 11/12/2016  8:55 AM EDT ----- Negative Pap and Negative GC/CT

## 2016-11-16 ENCOUNTER — Telehealth: Payer: Self-pay

## 2016-11-16 NOTE — Telephone Encounter (Signed)
Message left on voice mail- neg results per provider 

## 2016-12-10 ENCOUNTER — Ambulatory Visit (INDEPENDENT_AMBULATORY_CARE_PROVIDER_SITE_OTHER): Payer: Medicaid Other | Admitting: Obstetrics and Gynecology

## 2016-12-10 ENCOUNTER — Ambulatory Visit (INDEPENDENT_AMBULATORY_CARE_PROVIDER_SITE_OTHER): Payer: Medicaid Other

## 2016-12-10 VITALS — BP 123/74 | HR 93 | Wt 106.8 lb

## 2016-12-10 DIAGNOSIS — IMO0001 Reserved for inherently not codable concepts without codable children: Secondary | ICD-10-CM

## 2016-12-10 DIAGNOSIS — O358XX0 Maternal care for other (suspected) fetal abnormality and damage, not applicable or unspecified: Secondary | ICD-10-CM

## 2016-12-10 DIAGNOSIS — Z3482 Encounter for supervision of other normal pregnancy, second trimester: Secondary | ICD-10-CM | POA: Diagnosis not present

## 2016-12-10 DIAGNOSIS — O09899 Supervision of other high risk pregnancies, unspecified trimester: Secondary | ICD-10-CM | POA: Insufficient documentation

## 2016-12-10 LAB — POCT URINALYSIS DIPSTICK
Bilirubin, UA: NEGATIVE
GLUCOSE UA: NEGATIVE
Ketones, UA: NEGATIVE
Nitrite, UA: NEGATIVE
PH UA: 7 (ref 5.0–8.0)
Protein, UA: NEGATIVE
SPEC GRAV UA: 1.01 (ref 1.010–1.025)
UROBILINOGEN UA: 0.2 U/dL

## 2016-12-10 NOTE — Progress Notes (Signed)
ROB: Patient doing well, no complaints.  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  Discussed findings with patient and low association with genetic disorders such as Down Syndrome.  Patient still declines genetic testing. Still needs prenatal labs, will perform today. RTC in 4 weeks.

## 2016-12-11 DIAGNOSIS — IMO0002 Reserved for concepts with insufficient information to code with codable children: Secondary | ICD-10-CM | POA: Insufficient documentation

## 2016-12-11 DIAGNOSIS — Z3482 Encounter for supervision of other normal pregnancy, second trimester: Secondary | ICD-10-CM | POA: Insufficient documentation

## 2016-12-11 DIAGNOSIS — O358XX0 Maternal care for other (suspected) fetal abnormality and damage, not applicable or unspecified: Secondary | ICD-10-CM | POA: Insufficient documentation

## 2016-12-11 LAB — VARICELLA ZOSTER ANTIBODY, IGG: Varicella zoster IgG: 1960 index (ref >165)

## 2016-12-11 LAB — CBC WITH DIFFERENTIAL
BASOS ABS: 0 10*3/uL (ref 0.0–0.2)
Basos: 0 %
EOS (ABSOLUTE): 0 10*3/uL (ref 0.0–0.4)
Eos: 0 %
HEMOGLOBIN: 9 g/dL — AB (ref 11.1–15.9)
Hematocrit: 29.4 % — ABNORMAL LOW (ref 34.0–46.6)
IMMATURE GRANULOCYTES: 0 %
Immature Grans (Abs): 0 10*3/uL (ref 0.0–0.1)
LYMPHS ABS: 2.6 10*3/uL (ref 0.7–3.1)
Lymphs: 32 %
MCH: 21.2 pg — AB (ref 26.6–33.0)
MCHC: 30.6 g/dL — AB (ref 31.5–35.7)
MCV: 69 fL — ABNORMAL LOW (ref 79–97)
MONOCYTES: 4 %
Monocytes Absolute: 0.3 10*3/uL (ref 0.1–0.9)
NEUTROS ABS: 5.2 10*3/uL (ref 1.4–7.0)
NEUTROS PCT: 64 %
RBC: 4.25 x10E6/uL (ref 3.77–5.28)
RDW: 17.5 % — AB (ref 12.3–15.4)
WBC: 8.2 10*3/uL (ref 3.4–10.8)

## 2016-12-11 LAB — RPR: RPR Ser Ql: NONREACTIVE

## 2016-12-11 LAB — ABO AND RH: RH TYPE: POSITIVE

## 2016-12-11 LAB — RUBELLA SCREEN: Rubella Antibodies, IGG: 1.29 index (ref >0.99)

## 2016-12-11 LAB — HIV ANTIBODY (ROUTINE TESTING W REFLEX): HIV SCREEN 4TH GENERATION: NONREACTIVE

## 2016-12-11 LAB — SICKLE CELL SCREEN: SICKLE CELL SCREEN: NEGATIVE

## 2016-12-11 LAB — ANTIBODY SCREEN: ANTIBODY SCREEN: NEGATIVE

## 2016-12-11 LAB — HEPATITIS B SURFACE ANTIGEN: HEP B S AG: NEGATIVE

## 2016-12-12 LAB — MONITOR DRUG PROFILE 14(MW)
Amphetamine Scrn, Ur: NEGATIVE ng/mL
BARBITURATE SCREEN URINE: NEGATIVE ng/mL
BENZODIAZEPINE SCREEN, URINE: NEGATIVE ng/mL
Buprenorphine, Urine: NEGATIVE ng/mL
CANNABINOIDS UR QL SCN: NEGATIVE ng/mL
CREATININE(CRT), U: 28.5 mg/dL (ref 20.0–300.0)
Cocaine (Metab) Scrn, Ur: NEGATIVE ng/mL
Fentanyl, Urine: NEGATIVE pg/mL
METHADONE SCREEN, URINE: NEGATIVE ng/mL
Meperidine Screen, Urine: NEGATIVE ng/mL
OPIATE SCREEN URINE: NEGATIVE ng/mL
OXYCODONE+OXYMORPHONE UR QL SCN: NEGATIVE ng/mL
PH UR, DRUG SCRN: 6.7 (ref 4.5–8.9)
Phencyclidine Qn, Ur: NEGATIVE ng/mL
Propoxyphene Scrn, Ur: NEGATIVE ng/mL
SPECIFIC GRAVITY: 1.006
TRAMADOL SCREEN, URINE: NEGATIVE ng/mL

## 2016-12-12 LAB — URINE CULTURE: Organism ID, Bacteria: NO GROWTH

## 2016-12-12 LAB — URINALYSIS, ROUTINE W REFLEX MICROSCOPIC
BILIRUBIN UA: NEGATIVE
Glucose, UA: NEGATIVE
Ketones, UA: NEGATIVE
Nitrite, UA: NEGATIVE
PH UA: 7 (ref 5.0–7.5)
PROTEIN UA: NEGATIVE
Specific Gravity, UA: 1.006 (ref 1.005–1.030)
Urobilinogen, Ur: 0.2 mg/dL (ref 0.2–1.0)

## 2016-12-12 LAB — MICROSCOPIC EXAMINATION: Casts: NONE SEEN /lpf

## 2016-12-12 LAB — NICOTINE SCREEN, URINE: COTININE UR QL SCN: NEGATIVE ng/mL

## 2017-01-07 ENCOUNTER — Ambulatory Visit (INDEPENDENT_AMBULATORY_CARE_PROVIDER_SITE_OTHER): Payer: Medicaid Other | Admitting: Obstetrics and Gynecology

## 2017-01-07 VITALS — BP 123/74 | HR 94 | Wt 115.8 lb

## 2017-01-07 DIAGNOSIS — IMO0001 Reserved for inherently not codable concepts without codable children: Secondary | ICD-10-CM

## 2017-01-07 DIAGNOSIS — Z3482 Encounter for supervision of other normal pregnancy, second trimester: Secondary | ICD-10-CM

## 2017-01-07 DIAGNOSIS — O358XX0 Maternal care for other (suspected) fetal abnormality and damage, not applicable or unspecified: Secondary | ICD-10-CM

## 2017-01-07 LAB — POCT URINALYSIS DIPSTICK
BILIRUBIN UA: NEGATIVE
Blood, UA: NEGATIVE
GLUCOSE UA: NEGATIVE
KETONES UA: NEGATIVE
NITRITE UA: NEGATIVE
PH UA: 7 (ref 5.0–8.0)
Protein, UA: NEGATIVE
Spec Grav, UA: 1.01 (ref 1.010–1.025)
Urobilinogen, UA: 0.2 E.U./dL

## 2017-01-07 MED ORDER — PRENATAL MULTIVITAMIN CH: 1.0000 | ORAL_TABLET | Freq: Every day | ORAL | 3 refills | Status: DC

## 2017-01-07 NOTE — Addendum Note (Signed)
Addended by: Hyacinth MeekerIMAS, Inella Kuwahara S on: 01/07/2017 04:26 PM   Modules accepted: Orders

## 2017-01-07 NOTE — Addendum Note (Signed)
Addended by: Marchelle FolksMILLER, Michal Strzelecki G on: 01/07/2017 04:44 PM   Modules accepted: Kipp BroodSmartSet

## 2017-01-07 NOTE — Progress Notes (Signed)
ROB:  Discussed EIF - pt declines genetic testing - f/u U/S ordered.  1 hr GCT with next visit. Declined flu vac.

## 2017-01-07 NOTE — Progress Notes (Signed)
Patient here today for routine OB follow-up. Last ov was 12/10/16 with Dr. Valentino Saxonherry. Patient had anatomy scan done and declined genetic testing.  Patient denies any symptoms today and declines flu vaccine today.

## 2017-01-08 ENCOUNTER — Other Ambulatory Visit: Payer: Self-pay

## 2017-01-08 MED ORDER — PRENATAL MULTIVITAMIN CH: 1.0000 | ORAL_TABLET | Freq: Every day | ORAL | 3 refills | Status: DC

## 2017-01-08 MED ORDER — CONCEPT OB 130-92.4-1 MG PO CAPS: 1.0000 | ORAL_CAPSULE | Freq: Every day | ORAL | 11 refills | Status: AC

## 2017-01-25 ENCOUNTER — Other Ambulatory Visit: Payer: Self-pay | Admitting: Obstetrics and Gynecology

## 2017-01-25 DIAGNOSIS — Z369 Encounter for antenatal screening, unspecified: Secondary | ICD-10-CM

## 2017-02-02 ENCOUNTER — Ambulatory Visit: Payer: Medicaid Other

## 2017-02-02 ENCOUNTER — Ambulatory Visit (INDEPENDENT_AMBULATORY_CARE_PROVIDER_SITE_OTHER): Payer: Medicaid Other

## 2017-02-02 ENCOUNTER — Ambulatory Visit (INDEPENDENT_AMBULATORY_CARE_PROVIDER_SITE_OTHER): Payer: Medicaid Other | Admitting: Obstetrics and Gynecology

## 2017-02-02 VITALS — BP 115/71 | HR 90 | Wt 118.9 lb

## 2017-02-02 DIAGNOSIS — Z369 Encounter for antenatal screening, unspecified: Secondary | ICD-10-CM

## 2017-02-02 DIAGNOSIS — Z3482 Encounter for supervision of other normal pregnancy, second trimester: Secondary | ICD-10-CM

## 2017-02-02 DIAGNOSIS — O09899 Supervision of other high risk pregnancies, unspecified trimester: Secondary | ICD-10-CM

## 2017-02-02 DIAGNOSIS — Z131 Encounter for screening for diabetes mellitus: Secondary | ICD-10-CM

## 2017-02-02 DIAGNOSIS — R12 Heartburn: Secondary | ICD-10-CM

## 2017-02-02 DIAGNOSIS — O26893 Other specified pregnancy related conditions, third trimester: Secondary | ICD-10-CM

## 2017-02-02 DIAGNOSIS — Z3403 Encounter for supervision of normal first pregnancy, third trimester: Secondary | ICD-10-CM

## 2017-02-02 LAB — POCT URINALYSIS DIPSTICK
Bilirubin, UA: NEGATIVE
GLUCOSE UA: NEGATIVE
Ketones, UA: NEGATIVE
NITRITE UA: NEGATIVE
PH UA: 6.5 (ref 5.0–8.0)
PROTEIN UA: NEGATIVE
RBC UA: NEGATIVE
SPEC GRAV UA: 1.01 (ref 1.010–1.025)
UROBILINOGEN UA: 0.2 U/dL

## 2017-02-02 MED ORDER — RANITIDINE HCL 150 MG PO TABS: 150.0000 mg | ORAL_TABLET | Freq: Two times a day (BID) | ORAL | 6 refills | Status: AC

## 2017-02-02 NOTE — Progress Notes (Signed)
ROB: Notes heartburn, Tums no longer working. Will prescribe Zantac.  For 28 week labs today.  Desires to breast and formula feed,  Desires condoms for contraception. Declines Tdap and flu vaccine today, signed blood consent, discussed cord blood banking. Repeat ultrasound still noting single cardiac EIF. Informed patient of results.  Still declines genetic testing.  Will likely need f/u after delivery.  RTC in 2 weeks.

## 2017-02-02 NOTE — Progress Notes (Signed)
ROB- glucola done, blood consent signed, pt is having some heartburn, nausea

## 2017-02-03 LAB — CBC
HEMATOCRIT: 32.5 % — AB (ref 34.0–46.6)
Hemoglobin: 9.8 g/dL — ABNORMAL LOW (ref 11.1–15.9)
MCH: 22.6 pg — AB (ref 26.6–33.0)
MCHC: 30.2 g/dL — AB (ref 31.5–35.7)
MCV: 75 fL — ABNORMAL LOW (ref 79–97)
Platelets: 240 10*3/uL (ref 150–379)
RBC: 4.33 x10E6/uL (ref 3.77–5.28)
RDW: 19.3 % — AB (ref 12.3–15.4)
WBC: 9.3 10*3/uL (ref 3.4–10.8)

## 2017-02-03 LAB — GLUCOSE, 1 HOUR GESTATIONAL: GESTATIONAL DIABETES SCREEN: 102 mg/dL (ref 65–139)

## 2017-02-08 ENCOUNTER — Telehealth: Payer: Self-pay | Admitting: Obstetrics and Gynecology

## 2017-02-08 MED ORDER — IRON 325 (65 FE) MG PO TABS: 1.0000 | ORAL_TABLET | Freq: Two times a day (BID) | ORAL | 2 refills | Status: DC

## 2017-02-08 NOTE — Telephone Encounter (Signed)
Spoke with pt about lab results per Dr. Valentino Saxonherry. Pt understood and agreed to the iron pill. Per pt she requested this be called in. This was sent into her pharmacy of choice. She had no further questions at this time. Nothing further is needed.  Notes recorded by Hildred Laserherry, Anika, MD on 02/03/2017 at 8:47 AM EST Please inform patient that her gestational diabetes screen was normal, but that she is still anemic. Would recommend an iron tablet (325 mg) BID. Can be prescribed or purchased over the counter. Also encourage patient to sign up for Mychart

## 2017-02-16 ENCOUNTER — Encounter: Payer: Self-pay | Admitting: Obstetrics and Gynecology

## 2017-02-16 ENCOUNTER — Ambulatory Visit (INDEPENDENT_AMBULATORY_CARE_PROVIDER_SITE_OTHER): Payer: Medicaid Other | Admitting: Obstetrics and Gynecology

## 2017-02-16 VITALS — BP 101/60 | HR 111 | Wt 122.1 lb

## 2017-02-16 DIAGNOSIS — O358XX0 Maternal care for other (suspected) fetal abnormality and damage, not applicable or unspecified: Secondary | ICD-10-CM

## 2017-02-16 DIAGNOSIS — IMO0001 Reserved for inherently not codable concepts without codable children: Secondary | ICD-10-CM

## 2017-02-16 DIAGNOSIS — Z3403 Encounter for supervision of normal first pregnancy, third trimester: Secondary | ICD-10-CM

## 2017-02-16 LAB — POCT URINALYSIS DIPSTICK
Bilirubin, UA: NEGATIVE
GLUCOSE UA: NEGATIVE
Ketones, UA: NEGATIVE
LEUKOCYTES UA: NEGATIVE
NITRITE UA: NEGATIVE
PH UA: 5 (ref 5.0–8.0)
RBC UA: NEGATIVE
Urobilinogen, UA: 0.2 E.U./dL

## 2017-02-16 NOTE — Progress Notes (Signed)
ROB: Complains of occasional headaches.  Seen in urgent care and given Tylenol No. 3 prescription.  I advised her to use a vaporizer at night and to better hydrate.  Zantac has worked well for her heartburn.

## 2017-03-05 ENCOUNTER — Ambulatory Visit (INDEPENDENT_AMBULATORY_CARE_PROVIDER_SITE_OTHER): Payer: Medicaid Other | Admitting: Obstetrics and Gynecology

## 2017-03-05 ENCOUNTER — Encounter: Payer: Self-pay | Admitting: Obstetrics and Gynecology

## 2017-03-05 VITALS — BP 94/62 | HR 106 | Wt 125.8 lb

## 2017-03-05 DIAGNOSIS — Z3403 Encounter for supervision of normal first pregnancy, third trimester: Secondary | ICD-10-CM

## 2017-03-05 LAB — POCT URINALYSIS DIPSTICK
BILIRUBIN UA: NEGATIVE
GLUCOSE UA: NEGATIVE
Ketones, UA: NEGATIVE
Leukocytes, UA: NEGATIVE
Nitrite, UA: NEGATIVE
Protein, UA: NEGATIVE
RBC UA: NEGATIVE
Spec Grav, UA: 1.01 (ref 1.010–1.025)
Urobilinogen, UA: 0.2 E.U./dL
pH, UA: 7 (ref 5.0–8.0)

## 2017-03-05 NOTE — Progress Notes (Signed)
ROB: Doing well, no complaints.  Desires to discuss contraception, wants to try Lo-Loestrin postpartum.  Discussed risks/benefits.  RTC in 2 weeks.

## 2017-03-05 NOTE — Progress Notes (Signed)
ROB- Pt states she is doing well 

## 2017-03-25 ENCOUNTER — Telehealth: Payer: Self-pay

## 2017-03-25 NOTE — Telephone Encounter (Signed)
Message left on pts voicemail to reschedule appointment to the AM.

## 2017-03-26 ENCOUNTER — Encounter: Payer: Medicaid Other | Admitting: Obstetrics and Gynecology

## 2017-07-27 ENCOUNTER — Encounter (HOSPITAL_COMMUNITY): Payer: Self-pay

## 2019-08-30 ENCOUNTER — Inpatient Hospital Stay (HOSPITAL_BASED_OUTPATIENT_CLINIC_OR_DEPARTMENT_OTHER): Payer: Medicaid Other

## 2019-08-30 ENCOUNTER — Inpatient Hospital Stay (HOSPITAL_COMMUNITY)
Admission: AD | Admit: 2019-08-30 | Discharge: 2019-08-30 | Disposition: A | Discharge status: Home / Self Care | Payer: Medicaid Other | Attending: Family Medicine | Admitting: Family Medicine

## 2019-08-30 ENCOUNTER — Encounter: Payer: Self-pay | Admitting: Advanced Practice Midwife

## 2019-08-30 ENCOUNTER — Other Ambulatory Visit: Payer: Self-pay

## 2019-08-30 DIAGNOSIS — Z635 Disruption of family by separation and divorce: Secondary | ICD-10-CM | POA: Insufficient documentation

## 2019-08-30 DIAGNOSIS — O368930 Maternal care for other specified fetal problems, third trimester, not applicable or unspecified: Secondary | ICD-10-CM

## 2019-08-30 DIAGNOSIS — N859 Noninflammatory disorder of uterus, unspecified: Secondary | ICD-10-CM

## 2019-08-30 DIAGNOSIS — Z87891 Personal history of nicotine dependence: Secondary | ICD-10-CM | POA: Diagnosis not present

## 2019-08-30 DIAGNOSIS — R82998 Other abnormal findings in urine: Secondary | ICD-10-CM | POA: Insufficient documentation

## 2019-08-30 DIAGNOSIS — O0933 Supervision of pregnancy with insufficient antenatal care, third trimester: Secondary | ICD-10-CM | POA: Insufficient documentation

## 2019-08-30 DIAGNOSIS — O26893 Other specified pregnancy related conditions, third trimester: Secondary | ICD-10-CM | POA: Diagnosis present

## 2019-08-30 DIAGNOSIS — O4703 False labor before 37 completed weeks of gestation, third trimester: Secondary | ICD-10-CM | POA: Diagnosis not present

## 2019-08-30 DIAGNOSIS — M545 Low back pain: Secondary | ICD-10-CM | POA: Diagnosis not present

## 2019-08-30 DIAGNOSIS — Z3A34 34 weeks gestation of pregnancy: Secondary | ICD-10-CM | POA: Diagnosis not present

## 2019-08-30 DIAGNOSIS — N858 Other specified noninflammatory disorders of uterus: Secondary | ICD-10-CM

## 2019-08-30 DIAGNOSIS — O358XX0 Maternal care for other (suspected) fetal abnormality and damage, not applicable or unspecified: Secondary | ICD-10-CM

## 2019-08-30 LAB — URINALYSIS, ROUTINE W REFLEX MICROSCOPIC
Bilirubin Urine: NEGATIVE
Glucose, UA: 50 mg/dL — AB
Hgb urine dipstick: NEGATIVE
Ketones, ur: NEGATIVE mg/dL
Nitrite: NEGATIVE
Protein, ur: 100 mg/dL — AB
Specific Gravity, Urine: 1.02 (ref 1.005–1.030)
pH: 6 (ref 5.0–8.0)

## 2019-08-30 MED ORDER — LACTATED RINGERS IV BOLUS
1000.0000 mL | Freq: Once | INTRAVENOUS | Status: AC
  Administered 2019-08-30: 03:00:00 1000 mL via INTRAVENOUS

## 2019-08-30 MED ORDER — LACTATED RINGERS IV BOLUS
1000.0000 mL | Freq: Once | INTRAVENOUS | Status: AC
  Administered 2019-08-30: 04:00:00 1000 mL via INTRAVENOUS

## 2019-08-30 MED ORDER — PSEUDOEPHEDRINE HCL 30 MG PO TABS: 60.0000 mg | ORAL_TABLET | Freq: Once | ORAL | Status: DC

## 2019-08-30 MED ORDER — ONDANSETRON HCL 4 MG/2ML IJ SOLN
4.0000 mg | Freq: Once | INTRAMUSCULAR | Status: DC
  Filled 2019-08-30: qty 2

## 2019-08-30 MED ORDER — NIFEDIPINE 10 MG PO CAPS
10.0000 mg | ORAL_CAPSULE | ORAL | Status: AC | PRN
  Administered 2019-08-30 (×4): 10 mg via ORAL
  Filled 2019-08-30 (×4): qty 1

## 2019-08-30 NOTE — MAU Note (Addendum)
.  Samantha Randall is a 29 y.o. at Unknown here in MAU reporting: "tonight has been an emotional night" and that she was standing speaking with her friend and she began to have lower back pain. Pt reports she took a shower to see if that would help so she could go to bed and she reports the pain radiated from the front to the back "like the baby had dropped and I felt the need to bear down for him to come out." Pt reports the pain has now shifted from her front right to her back right. Pt reports she has felt tightening for the past few nights that has been painful that eventually let up. Pt reports "I may have another UTI because this is usually what happens when I try to pee, it's not a lot." Pt reports a recent UTI in April and that she has not been drinking much water lately. No VB. +FM. Pt is experiencing increased white discharge.   Onset of complaint: 08/30/19 at 0035 Pain score: 5/10 lower right back pain  Vitals:   08/30/19 0122  BP: 119/72  Pulse: (!) 108  Resp: 15  Temp: 98.2 F (36.8 C)  SpO2: 100%     FHT: initial FHR 155

## 2019-08-30 NOTE — Discharge Instructions (Signed)
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
## 2019-08-31 ENCOUNTER — Other Ambulatory Visit: Payer: Self-pay | Admitting: Advanced Practice Midwife

## 2019-08-31 DIAGNOSIS — N390 Urinary tract infection, site not specified: Secondary | ICD-10-CM | POA: Insufficient documentation

## 2019-08-31 DIAGNOSIS — N3000 Acute cystitis without hematuria: Secondary | ICD-10-CM

## 2019-08-31 MED ORDER — PENICILLIN V POTASSIUM 500 MG PO TABS: 500.0000 mg | ORAL_TABLET | Freq: Four times a day (QID) | ORAL | 0 refills | Status: AC

## 2019-08-31 NOTE — Progress Notes (Signed)
UTI noted on culture Rx Penicillin sent to [facility] because no pharmacy listed  Tried calling both numbers listed Both are registered to other people who do not know her \ Need to notify if she comes back  Aviva Signs, [place]

## 2019-09-01 ENCOUNTER — Telehealth: Payer: Self-pay | Admitting: Family Medicine

## 2019-09-01 LAB — CULTURE, OB URINE: Culture: >=100000 — AB

## 2019-09-01 NOTE — Telephone Encounter (Signed)
Attempted to contact patient x2 on cell number and home number to get her scheduled for prenatal care. A lady answered home number and stated that I had the wrong number. Cell number was called and a lady answered and stated that I had the wrong number as well.

## 2020-04-14 ENCOUNTER — Emergency Department (HOSPITAL_COMMUNITY)
Admission: EM | Admit: 2020-04-14 | Discharge: 2020-04-15 | Discharge status: Against Medical Advice | Payer: Medicaid Other

## 2020-04-15 ENCOUNTER — Other Ambulatory Visit: Payer: Self-pay

## 2020-04-15 ENCOUNTER — Inpatient Hospital Stay (HOSPITAL_COMMUNITY): Payer: Medicaid Other

## 2020-04-15 ENCOUNTER — Encounter (HOSPITAL_COMMUNITY): Payer: Self-pay | Admitting: Obstetrics and Gynecology

## 2020-04-15 ENCOUNTER — Ambulatory Visit (HOSPITAL_COMMUNITY)
Admission: EM | Admit: 2020-04-15 | Discharge: 2020-04-15 | Disposition: A | Discharge status: Home / Self Care | Payer: Medicaid Other | Attending: Family Medicine | Admitting: Family Medicine

## 2020-04-15 ENCOUNTER — Encounter (HOSPITAL_COMMUNITY): Payer: Self-pay

## 2020-04-15 ENCOUNTER — Inpatient Hospital Stay (HOSPITAL_COMMUNITY)
Admission: AD | Admit: 2020-04-15 | Discharge: 2020-04-16 | Disposition: A | Discharge status: Home / Self Care | Payer: Medicaid Other | Attending: Obstetrics and Gynecology | Admitting: Obstetrics and Gynecology

## 2020-04-15 DIAGNOSIS — Z3A01 Less than 8 weeks gestation of pregnancy: Secondary | ICD-10-CM | POA: Insufficient documentation

## 2020-04-15 DIAGNOSIS — N3000 Acute cystitis without hematuria: Secondary | ICD-10-CM | POA: Diagnosis not present

## 2020-04-15 DIAGNOSIS — O209 Hemorrhage in early pregnancy, unspecified: Secondary | ICD-10-CM | POA: Insufficient documentation

## 2020-04-15 DIAGNOSIS — Z87891 Personal history of nicotine dependence: Secondary | ICD-10-CM | POA: Insufficient documentation

## 2020-04-15 DIAGNOSIS — O2311 Infections of bladder in pregnancy, first trimester: Secondary | ICD-10-CM | POA: Insufficient documentation

## 2020-04-15 DIAGNOSIS — O4691 Antepartum hemorrhage, unspecified, first trimester: Secondary | ICD-10-CM | POA: Diagnosis not present

## 2020-04-15 DIAGNOSIS — O4690 Antepartum hemorrhage, unspecified, unspecified trimester: Secondary | ICD-10-CM

## 2020-04-15 DIAGNOSIS — O99891 Other specified diseases and conditions complicating pregnancy: Secondary | ICD-10-CM | POA: Diagnosis not present

## 2020-04-15 LAB — CBC WITH DIFFERENTIAL/PLATELET
Abs Immature Granulocytes: 0.04 10*3/uL (ref 0.00–0.07)
Basophils Absolute: 0.1 10*3/uL (ref 0.0–0.1)
Basophils Relative: 1 %
Eosinophils Absolute: 0.3 10*3/uL (ref 0.0–0.5)
Eosinophils Relative: 3 %
HCT: 40 % (ref 36.0–46.0)
Hemoglobin: 12.7 g/dL (ref 12.0–15.0)
Immature Granulocytes: 0 %
Lymphocytes Relative: 30 %
Lymphs Abs: 3.9 10*3/uL (ref 0.7–4.0)
MCH: 27 pg (ref 26.0–34.0)
MCHC: 31.8 g/dL (ref 30.0–36.0)
MCV: 84.9 fL (ref 80.0–100.0)
Monocytes Absolute: 0.9 10*3/uL (ref 0.1–1.0)
Monocytes Relative: 7 %
Neutro Abs: 7.7 10*3/uL (ref 1.7–7.7)
Neutrophils Relative %: 59 %
Platelets: 402 10*3/uL — ABNORMAL HIGH (ref 150–400)
RBC: 4.71 MIL/uL (ref 3.87–5.11)
RDW: 15.8 % — ABNORMAL HIGH (ref 11.5–15.5)
WBC: 12.9 10*3/uL — ABNORMAL HIGH (ref 4.0–10.5)
nRBC: 0 % (ref 0.0–0.2)

## 2020-04-15 LAB — COMPREHENSIVE METABOLIC PANEL
ALT: 20 U/L (ref 0–44)
AST: 23 U/L (ref 15–41)
Albumin: 4.6 g/dL (ref 3.5–5.0)
Alkaline Phosphatase: 87 U/L (ref 38–126)
Anion gap: 12 (ref 5–15)
BUN: 7 mg/dL (ref 6–20)
CO2: 20 mmol/L — ABNORMAL LOW (ref 22–32)
Calcium: 9.7 mg/dL (ref 8.9–10.3)
Chloride: 104 mmol/L (ref 98–111)
Creatinine, Ser: 0.57 mg/dL (ref 0.44–1.00)
GFR, Estimated: >60 mL/min (ref >60)
Glucose, Bld: 90 mg/dL (ref 70–99)
Potassium: 3.9 mmol/L (ref 3.5–5.1)
Sodium: 136 mmol/L (ref 135–145)
Total Bilirubin: 0.1 mg/dL — ABNORMAL LOW (ref 0.3–1.2)
Total Protein: 8.6 g/dL — ABNORMAL HIGH (ref 6.5–8.1)

## 2020-04-15 LAB — POCT URINALYSIS DIPSTICK, ED / UC
Bilirubin Urine: NEGATIVE
Glucose, UA: NEGATIVE mg/dL
Hgb urine dipstick: NEGATIVE
Nitrite: POSITIVE — AB
Protein, ur: NEGATIVE mg/dL
Specific Gravity, Urine: 1.025 (ref 1.005–1.030)
Urobilinogen, UA: 0.2 mg/dL (ref 0.0–1.0)
pH: 7 (ref 5.0–8.0)

## 2020-04-15 LAB — HCG, QUANTITATIVE, PREGNANCY: hCG, Beta Chain, Quant, S: 12406 m[IU]/mL — ABNORMAL HIGH (ref <5)

## 2020-04-15 LAB — POC URINE PREG, ED: Preg Test, Ur: POSITIVE — AB

## 2020-04-15 NOTE — Discharge Instructions (Signed)
Go immediate to MAU unit at [facility] for further evaluation of bleeding as your pregnancy test is positive. Your urine is also suspicious for UTI however since I am referring you over to NA you I will defer treatment until the source of your vaginal bleeding is identified.

## 2020-04-15 NOTE — MAU Note (Signed)
..  Samantha Randall is a 29 y.o. at [redacted]w[redacted]d here in MAU reporting: Vaginal bleeding that began on 04/12/2020 and left sided abdominal cramping that began on 04/10/2020. Reports she filled a tampon on Friday but has had spotting when she wipes since then.  LMP: 03/08/2020 Pain score: 4/10 Vitals:   04/15/20 2201  BP: 126/70  Pulse: (!) 104  Resp: 16  Temp: 98.8 F (37.1 C)  SpO2: 100%

## 2020-04-15 NOTE — ED Triage Notes (Signed)
Patient states she took 2 home pregnancy test Friday and both were positive. Pt is experiencing spotting since the positive tests. Pt states she has some mild cramping but nothing unusual. Pt is aox4 and ambulatory.

## 2020-04-15 NOTE — ED Provider Notes (Signed)
[facility]    CSN: [id number] Arrival date & time: 04/15/20  1900      History   Chief Complaint Chief Complaint  Patient presents with  . Vaginal Bleeding    Since friday    HPI Samantha Randall is a 30 y.o. female.   HPI Patient presents to UC today following 2 home pregnancy test x 3 days ago. She is concern as she experienced "menstrual period" type cramping with bleeding (spotting to moderate) x 4 days. Patient's last menstrual period was 03/08/2020 (exact date). patient has a documented history of insufficient prenatal care. Recently pregnant with deliver 09/2019. Hx of trichomoniasis during pregnancy.  Endorses nausea without vomitus. Denies any severe HA Past Medical History:  Diagnosis Date  . Anemia   . No pertinent past medical history     Patient Active Problem List   Diagnosis Date Noted  . UTI (urinary tract infection) 08/31/2019  . Fetal cardiac echogenic focus 12/11/2016  . Supervision of normal intrauterine pregnancy in multigravida in second trimester 12/11/2016  . Short interval between pregnancies affecting pregnancy, antepartum 12/10/2016    Past Surgical History:  Procedure Laterality Date  . NO PAST SURGERIES    . WISDOM TOOTH EXTRACTION      OB History    Gravida  7   Para  2   Term  2   Preterm      AB  3   Living  2     SAB  3   IAB      Ectopic      Multiple      Live Births  2            Home Medications    Prior to Admission medications   Medication Sig Start Date End Date Taking? Authorizing Provider  Prenat w/o A Vit-FeFum-FePo-FA (CONCEPT OB) 130-92.4-1 MG CAPS Take 1 capsule by mouth daily. 01/08/17   Linzie Collin, MD  ranitidine (ZANTAC) 150 MG tablet Take 1 tablet (150 mg total) 2 (two) times daily by mouth. 02/02/17   Hildred Laser, MD    Family History Family History  Problem Relation Age of Onset  . Diabetes Father   . Diabetes Sister   . Cancer Maternal Grandmother   .  Diabetes Maternal Grandmother     Social History Social History   Tobacco Use  . Smoking status: Former Games developer  . Smokeless tobacco: Never Used  Vaping Use  . Vaping Use: Never used  Substance Use Topics  . Alcohol use: No    Comment: social  . Drug use: No     Allergies   Ibuprofen   Review of Systems Review of Systems Pertinent negatives listed in HPI   Physical Exam Triage Vital Signs ED Triage Vitals  Enc Vitals Group     BP 04/15/20 1939 133/77     Pulse Rate 04/15/20 1939 88     Resp 04/15/20 1939 17     Temp 04/15/20 1939 98.3 F (36.8 C)     Temp Source 04/15/20 1939 Oral     SpO2 04/15/20 1939 100 %     Weight --      Height --      Head Circumference --      Peak Flow --      Pain Score 04/15/20 1940 0     Pain Loc --      Pain Edu? --      Excl. in GC? --  No data found.  Updated Vital Signs BP 133/77 (BP Location: Right Arm)   Pulse 88   Temp 98.3 F (36.8 C) (Oral)   Resp 17   LMP 03/08/2020 (Exact Date)   SpO2 100%   Visual Acuity Right Eye Distance:   Left Eye Distance:   Bilateral Distance:    Right Eye Near:   Left Eye Near:    Bilateral Near:     Physical Exam General appearance: alert, thin appearance, cooperative  Head: Normocephalic, without obvious abnormality, atraumatic Respiratory: Respirations even and unlabored, normal respiratory rate Heart: Rate and rhythm normal. No gallop or murmurs noted on exam  Abdomen: BS +, no distention, no rebound tenderness, or no mass Extremities: No gross deformities Skin: Skin color, texture, turgor normal. No rashes seen  Psych: Appropriate mood and affect. Neurologic: Alert, oriented to person, place, and time, thought content appropriate.  UC Treatments / Results  Labs (all labs ordered are listed, but only abnormal results are displayed) Labs Reviewed  POC URINE PREG, ED - Abnormal; Notable for the following components:      Result Value   Preg Test, Ur POSITIVE (*)     All other components within normal limits  POCT URINALYSIS DIPSTICK, ED / UC - Abnormal; Notable for the following components:   Ketones, ur TRACE (*)    Nitrite POSITIVE (*)    Leukocytes,Ua SMALL (*)    All other components within normal limits    EKG   Radiology No results found.  Procedures Procedures (including critical care time)  Medications Ordered in UC Medications - No data to display  Initial Impression / Assessment and Plan / UC Course  I have reviewed the triage vital signs and the nursing notes.  Pertinent labs & imaging results that were available during my care of the patient were reviewed by me and considered in my medical decision making (see chart for details).    Pregnancy less than 8 weeks based on LMP. Given vaginal bleeding and abdominal cramping, patient is being deferred to MAU for further evaluation. Patient with history of prior high risk pregnancy due to insufficient OB care and ETOH, patient warrants close follow-up. UA significant for UTI however, will defer to MAU concern if any treatment patient will fail to follow-up for evaluation for vaginal bleeding during confirmed early pregnancy. Final Clinical Impressions(s) / UC Diagnoses   Final diagnoses:  Less than [redacted] weeks gestation of pregnancy  Vaginal bleeding affecting early pregnancy     Discharge Instructions     Go immediate to MAU unit at [facility] for further evaluation of bleeding as your pregnancy test is positive. Your urine is also suspicious for UTI however since I am referring you over to NA you I will defer treatment until the source of your vaginal bleeding is identified.   ED Prescriptions    None     PDMP not reviewed this encounter.   Bing Neighbors, FNP 04/16/20 2151

## 2020-04-16 DIAGNOSIS — Z3A01 Less than 8 weeks gestation of pregnancy: Secondary | ICD-10-CM | POA: Diagnosis not present

## 2020-04-16 DIAGNOSIS — O4691 Antepartum hemorrhage, unspecified, first trimester: Secondary | ICD-10-CM

## 2020-04-16 DIAGNOSIS — O99891 Other specified diseases and conditions complicating pregnancy: Secondary | ICD-10-CM

## 2020-04-16 DIAGNOSIS — N3000 Acute cystitis without hematuria: Secondary | ICD-10-CM | POA: Diagnosis not present

## 2020-04-16 LAB — URINALYSIS, ROUTINE W REFLEX MICROSCOPIC
Bilirubin Urine: NEGATIVE
Glucose, UA: NEGATIVE mg/dL
Hgb urine dipstick: NEGATIVE
Ketones, ur: NEGATIVE mg/dL
Nitrite: POSITIVE — AB
Protein, ur: NEGATIVE mg/dL
Specific Gravity, Urine: 1.017 (ref 1.005–1.030)
pH: 7 (ref 5.0–8.0)

## 2020-04-16 MED ORDER — CEFADROXIL 500 MG PO CAPS: 500.0000 mg | ORAL_CAPSULE | Freq: Two times a day (BID) | ORAL | 0 refills | Status: AC

## 2020-04-16 NOTE — Discharge Instructions (Signed)
Pregnancy and Urinary Tract Infection  A urinary tract infection (UTI) is an infection of any part of the urinary tract. This includes the kidneys, the tubes that connect your kidneys to your bladder (ureters), the bladder, and the tube that carries urine out of your body (urethra). These organs make, store, and get rid of urine in the body. Your health care provider may use other names to describe the infection. An upper UTI affects the ureters and kidneys (pyelonephritis). A lower UTI affects the bladder (cystitis) and urethra (urethritis). Most urinary tract infections are caused by bacteria in your genital area, around the entrance to your urinary tract (urethra). These bacteria grow and cause irritation and inflammation of your urinary tract. You are more likely to develop a UTI during pregnancy because the physical and hormonal changes your body goes through can make it easier for bacteria to get into your urinary tract. Your growing baby also puts pressure on your bladder and can affect urine flow. It is important to recognize and treat UTIs in pregnancy because of the risk of serious complications for both you and your baby. How does this affect me? Symptoms of a UTI include:  Needing to urinate right away (urgently).  Frequent urination or passing small amounts of urine frequently.  Pain or burning with urination.  Blood in the urine.  Urine that smells bad or unusual.  Trouble urinating.  Cloudy urine.  Pain in the abdomen or lower back.  Vaginal discharge. You may also have:  Vomiting or a decreased appetite.  Confusion.  Irritability or tiredness.  A fever.  Diarrhea. How does this affect my baby? An untreated UTI during pregnancy could lead to a kidney infection or a systemic infection, which can cause health problems that could affect your baby. Possible complications of an untreated UTI include:  Giving birth to your baby before 37 weeks of pregnancy  (premature).  Having a baby with a low birth weight.  Developing high blood pressure during pregnancy (preeclampsia).  Having a low hemoglobin level (anemia). What can I do to lower my risk? To prevent a UTI:  Go to the bathroom as soon as you feel the need. Do not hold urine for long periods of time.  Always wipe from front to back, especially after a bowel movement. Use each tissue one time when you wipe.  Empty your bladder after sex.  Keep your genital area dry.  Drink 6-10 glasses of water each day.  Do not douche or use deodorant sprays. How is this treated? Treatment for this condition may include:  Antibiotic medicines that are safe to take during pregnancy.  Other medicines to treat less common causes of UTI. Follow these instructions at home:  If you were prescribed an antibiotic medicine, take it as told by your health care provider. Do not stop using the antibiotic even if you start to feel better.  Keep all follow-up visits as told by your health care provider. This is important. Contact a health care provider if:  Your symptoms do not improve or they get worse.  You have abnormal vaginal discharge. Get help right away if you:  Have a fever.  Have nausea and vomiting.  Have back or side pain.  Feel contractions in your uterus.  Have lower belly pain.  Have a gush of fluid from your vagina.  Have blood in your urine. Summary  A urinary tract infection (UTI) is an infection of any part of the urinary tract, which includes the   kidneys, ureters, bladder, and urethra.  Most urinary tract infections are caused by bacteria in your genital area, around the entrance to your urinary tract (urethra).  You are more likely to develop a UTI during pregnancy.  If you were prescribed an antibiotic medicine, take it as told by your health care provider. Do not stop using the antibiotic even if you start to feel better. This information is not intended to  replace advice given to you by your health care provider. Make sure you discuss any questions you have with your health care provider. Document Revised: 07/01/2018 Document Reviewed: 02/10/2018 Elsevier Patient Education  2021 [employer]  

## 2020-04-16 NOTE — MAU Provider Note (Signed)
Chief Complaint:  Vaginal Bleeding   None    HPI: Samantha Randall is a 30 y.o. [id number] at [redacted]w[redacted]d who presents to maternity admissions sent by [facility] for complete workup of early vaginal bleeding. Pt states that over the past two days she has experienced several episodes of vaginal bleeding, once at night leaving a large blood stain on her sheets and then several times since then. Denies other discharge, fever, falls or recent illness. Having only very mild diffuse cramping, none at this time.    Pregnancy Course: Has not established care, needs list of providers.  Past Medical History:  Diagnosis Date  . Anemia   . No pertinent past medical history    OB History  Gravida Para Term Preterm AB Living  6 3 3   2 3   SAB IAB Ectopic Multiple Live Births  2       3    # Outcome Date GA Lbr Len/2nd Weight Sex Delivery Anes PTL Lv  6 Current           5 Term 09/15/19    M Vag-Spont   LIV  4 Term 04/30/17 [redacted]w[redacted]d   M Vag-Spont   LIV  3 Term 03/01/16 [redacted]w[redacted]d   F Vag-Spont  N LIV  2 SAB 2016          1 SAB 2013           Past Surgical History:  Procedure Laterality Date  . NO PAST SURGERIES    . WISDOM TOOTH EXTRACTION     Family History  Problem Relation Age of Onset  . Diabetes Father   . Diabetes Sister   . Cancer Maternal Grandmother   . Diabetes Maternal Grandmother    Social History   Tobacco Use  . Smoking status: Former 2014  . Smokeless tobacco: Never Used  Vaping Use  . Vaping Use: Never used  Substance Use Topics  . Alcohol use: No    Comment: social  . Drug use: No   Allergies  Allergen Reactions  . Ibuprofen Dermatitis   No medications prior to admission.   I have reviewed patient's Past Medical Hx, Surgical Hx, Family Hx, Social Hx, medications and allergies.   ROS:  Review of Systems  Constitutional: Negative for fatigue and fever.  HENT: Negative for congestion and sore throat.   Eyes: Negative for visual disturbance.  Respiratory: Negative  for cough and shortness of breath.   Gastrointestinal: Positive for abdominal pain (mild diffuse across lower abd). Negative for nausea and vomiting.  Genitourinary: Positive for vaginal bleeding.  Neurological: Negative for dizziness, syncope and headaches.  All other systems reviewed and are negative.  Physical Exam   Patient Vitals for the past 24 hrs:  BP Temp Temp src Pulse Resp SpO2 Height Weight  04/15/20 2201 126/70 98.8 F (37.1 C) Oral (!) 104 16 100 % 5\' 2"  (1.575 m) 111 lb 14.4 oz (50.8 kg)   Constitutional: Well-developed, well-nourished female in no acute distress (appears fatigued).  Cardiovascular: normal rate & rhythm Respiratory: normal effort GI: Abd soft, non-tender MS: Extremities nontender, no edema, normal ROM Neurologic: Alert and oriented x 4.  GU: no CVA tenderness Pelvic exam deferred - sent straight to U/S   Labs: Results for orders placed or performed during the hospital encounter of 04/15/20 (from the past 24 hour(s))  CBC with Differential/Platelet     Status: Abnormal   Collection Time: 04/15/20  9:25 PM  Result Value Ref Range  WBC 12.9 (H) 4.0 - 10.5 K/uL   RBC 4.71 3.87 - 5.11 MIL/uL   Hemoglobin 12.7 12.0 - 15.0 g/dL   HCT 20.2 54.2 - 70.6 %   MCV 84.9 80.0 - 100.0 fL   MCH 27.0 26.0 - 34.0 pg   MCHC 31.8 30.0 - 36.0 g/dL   RDW 23.7 (H) 62.8 - 31.5 %   Platelets 402 (H) 150 - 400 K/uL   nRBC 0.0 0.0 - 0.2 %   Neutrophils Relative % 59 %   Neutro Abs 7.7 1.7 - 7.7 K/uL   Lymphocytes Relative 30 %   Lymphs Abs 3.9 0.7 - 4.0 K/uL   Monocytes Relative 7 %   Monocytes Absolute 0.9 0.1 - 1.0 K/uL   Eosinophils Relative 3 %   Eosinophils Absolute 0.3 0.0 - 0.5 K/uL   Basophils Relative 1 %   Basophils Absolute 0.1 0.0 - 0.1 K/uL   Immature Granulocytes 0 %   Abs Immature Granulocytes 0.04 0.00 - 0.07 K/uL  Comprehensive metabolic panel     Status: Abnormal   Collection Time: 04/15/20  9:25 PM  Result Value Ref Range   Sodium 136 135 -  145 mmol/L   Potassium 3.9 3.5 - 5.1 mmol/L   Chloride 104 98 - 111 mmol/L   CO2 20 (L) 22 - 32 mmol/L   Glucose, Bld 90 70 - 99 mg/dL   BUN 7 6 - 20 mg/dL   Creatinine, Ser 1.76 0.44 - 1.00 mg/dL   Calcium 9.7 8.9 - 16.0 mg/dL   Total Protein 8.6 (H) 6.5 - 8.1 g/dL   Albumin 4.6 3.5 - 5.0 g/dL   AST 23 15 - 41 U/L   ALT 20 0 - 44 U/L   Alkaline Phosphatase 87 38 - 126 U/L   Total Bilirubin 0.1 (L) 0.3 - 1.2 mg/dL   GFR, Estimated >73 >71 mL/min   Anion gap 12 5 - 15  hCG, quantitative, pregnancy     Status: Abnormal   Collection Time: 04/15/20  9:25 PM  Result Value Ref Range   hCG, Beta Chain, Quant, S 12,406 (H) <5 mIU/mL  Urinalysis, Routine w reflex microscopic Urine, Clean Catch     Status: Abnormal   Collection Time: 04/15/20 11:22 PM  Result Value Ref Range   Color, Urine YELLOW YELLOW   APPearance HAZY (A) CLEAR   Specific Gravity, Urine 1.017 1.005 - 1.030   pH 7.0 5.0 - 8.0   Glucose, UA NEGATIVE NEGATIVE mg/dL   Hgb urine dipstick NEGATIVE NEGATIVE   Bilirubin Urine NEGATIVE NEGATIVE   Ketones, ur NEGATIVE NEGATIVE mg/dL   Protein, ur NEGATIVE NEGATIVE mg/dL   Nitrite POSITIVE (A) NEGATIVE   Leukocytes,Ua SMALL (A) NEGATIVE   RBC / HPF 0-5 0 - 5 RBC/hpf   WBC, UA 11-20 0 - 5 WBC/hpf   Bacteria, UA MANY (A) NONE SEEN   Squamous Epithelial / LPF 0-5 0 - 5   Mucus PRESENT     Imaging:  US OB Comp Less 14 Wks  Result Date: 04/15/2020 CLINICAL DATA:  Vaginal bleeding, pelvic cramping, positive pregnancy test. LMP 03/08/2020 EXAM: OBSTETRIC <14 WK ULTRASOUND TECHNIQUE: Transabdominal ultrasound was performed for evaluation of the gestation as well as the maternal uterus and adnexal regions. Transvaginal sonography was deferred by the patient. COMPARISON:  None. FINDINGS: Intrauterine gestational sac: Present, single Yolk sac:  Not visualized Embryo:  Not visualized Cardiac Activity: Not applicable MSD:  8 mm   5 w   3  d Subchorionic hemorrhage:  None visualized.  Maternal uterus/adnexae: The uterus is anteverted. No definite intrauterine masses are seen. The cervix is not well visualized on this examination. No free fluid within the cul-de-sac. The maternal ovaries are unremarkable. Corpus luteum noted within the right ovary. IMPRESSION: Single intrauterine gestational sac identified without visualization of the yolk sac. This roughly estimates the age of gestation between 5.0 and 5.5 weeks. Follow-up sonography in 14-21 days is recommended to document appropriate progression and better age the gestation. Electronically Signed   By: Helyn Numbers MD   On: 04/15/2020 23:53   MAU Course: Orders Placed This Encounter  Procedures  . OB Urine Culture  . US OB Comp Less 14 Wks  . CBC with Differential/Platelet  . Comprehensive metabolic panel  . hCG, quantitative, pregnancy  . Urinalysis, Routine w reflex microscopic Urine, Clean Catch  . Discharge patient   Meds ordered this encounter  Medications  . cefadroxil (DURICEF) 500 MG capsule    Sig: Take 1 capsule (500 mg total) by mouth 2 (two) times daily.    Dispense:  14 capsule    Refill:  0    Order Specific Question:   Supervising Provider    Answer:   Samara Snide   MDM: Workup completed with CBC, CMP, bHCG and U/S - all normal - U/S showed IUGS without YS, embryo or cardiac activity. Dates match LMP.  Advised pt to have repeat bHCG in two days (appt scheduled at [facility] lab for 04/18/20 at 1:30pm)  Discussed possible reasons for early vaginal bleeding and gave general reassurance and encouragement  Assessment: 1. Acute cystitis without hematuria   2. Vaginal bleeding during pregnancy    Plan: Discharge home in stable condition with ectopic/bleeding precautions.    Follow-up Information    [facility]. Go on 04/18/2020.   Specialty: Obstetrics and Gynecology Why: You have a lab appointment at 1:30pm  Contact information: [address] [place] [address] [phone]              Allergies as of 04/16/2020      Reactions   Ibuprofen Dermatitis      Medication List    TAKE these medications   cefadroxil 500 MG capsule Commonly known as: DURICEF Take 1 capsule (500 mg total) by mouth 2 (two) times daily.   Concept OB 130-92.4-1 MG Caps Take 1 capsule by mouth daily.   ranitidine 150 MG tablet Commonly known as: Zantac Take 1 tablet (150 mg total) 2 (two) times daily by mouth.      Edd Arbour, CNM, MSN, IBCLC Certified Nurse Midwife, [facility]

## 2020-04-18 ENCOUNTER — Other Ambulatory Visit: Payer: Self-pay

## 2020-04-18 ENCOUNTER — Other Ambulatory Visit: Payer: Medicaid Other

## 2020-04-18 DIAGNOSIS — O4690 Antepartum hemorrhage, unspecified, unspecified trimester: Secondary | ICD-10-CM

## 2020-04-18 LAB — CULTURE, OB URINE: Culture: >=100000 — AB

## 2021-06-07 IMAGING — US US OB COMP LESS 14 WK
1 series · 15 of 28 positions shown · non-contrast
Comparison: None.

CLINICAL DATA: Vaginal bleeding, pelvic cramping, positive
pregnancy test. LMP 03/08/2020

EXAM:
OBSTETRIC <14 WK ULTRASOUND
TECHNIQUE: Transabdominal ultrasound was performed for evaluation of the
gestation as well as the maternal uterus and adnexal regions.
Transvaginal sonography was deferred by the patient.

[Series 1: us ob comp less 14 wk · 15 of 34 slices shown]
[im 1/34]
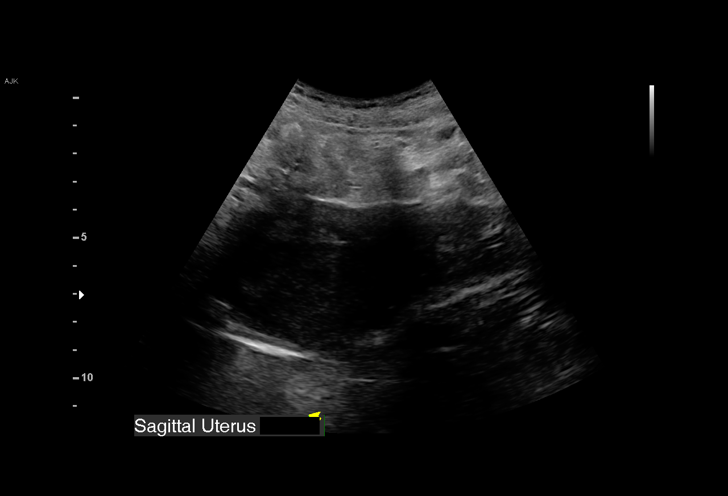
[im 3/34]
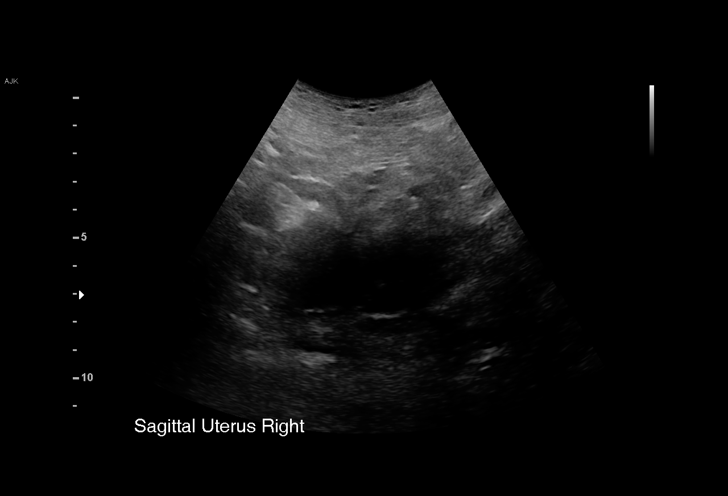
[im 5/34]
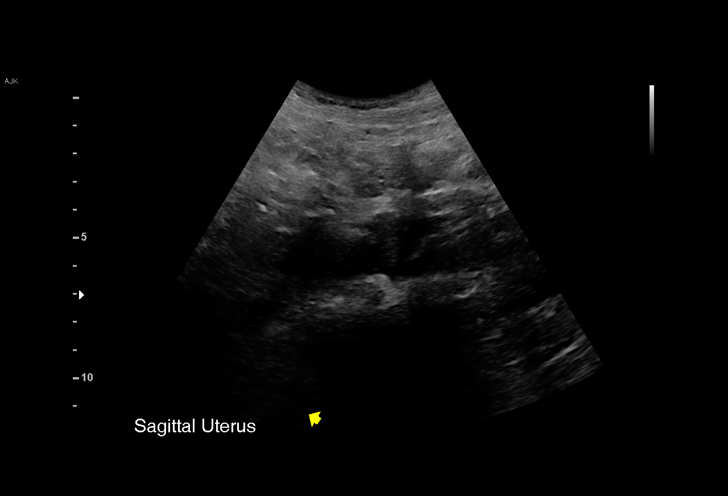
[im 8/34]
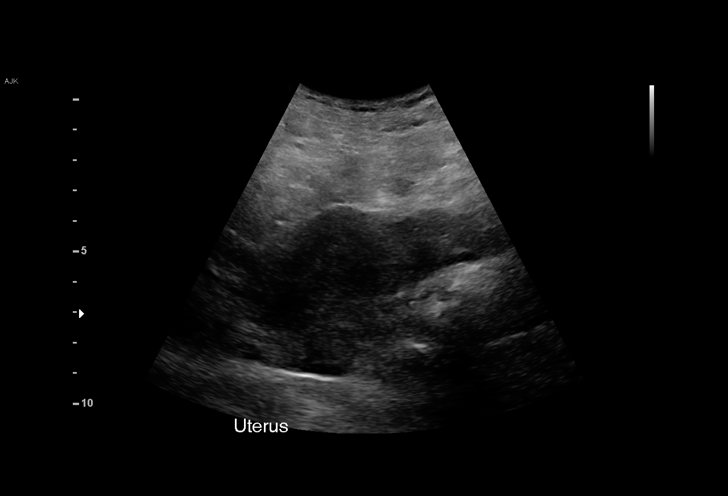
[im 10/34]
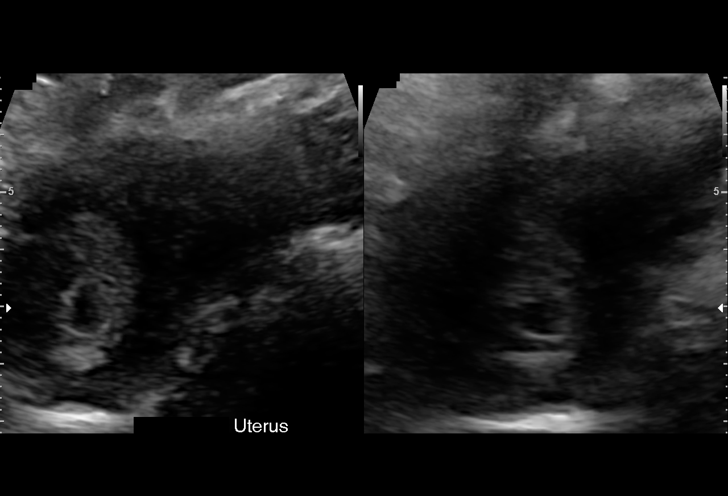
[im 13/34]
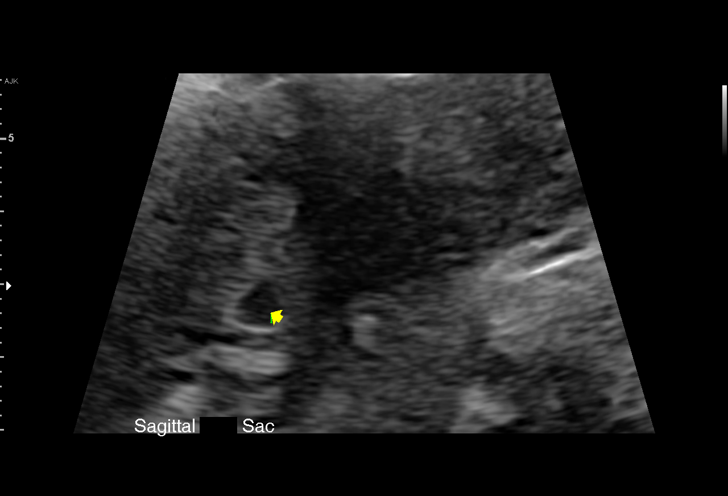
[im 15/34]
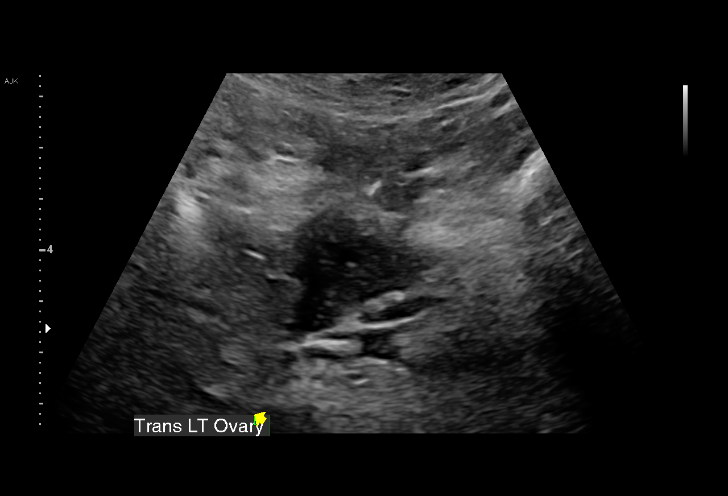
[im 18/34]
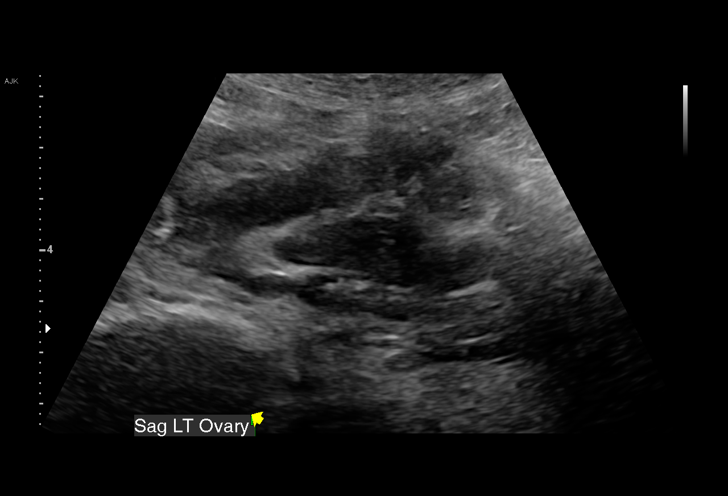
[im 19/34]
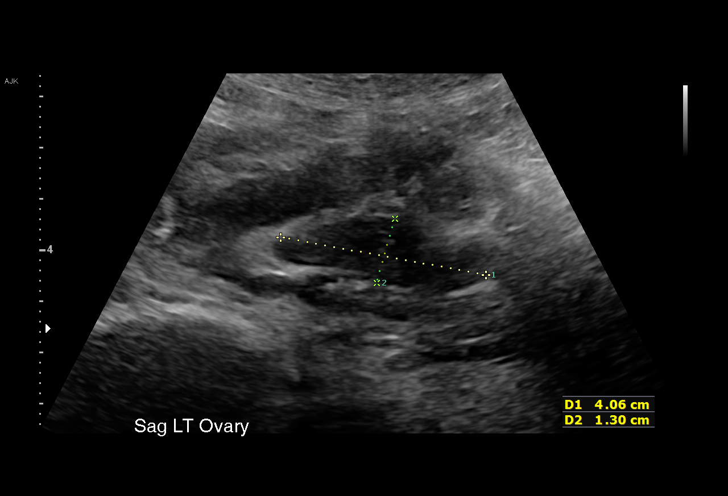
[im 21/34]
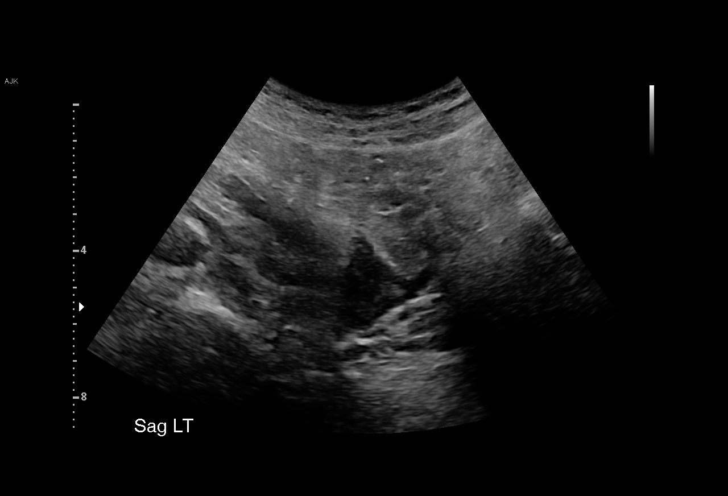
[im 24/34]
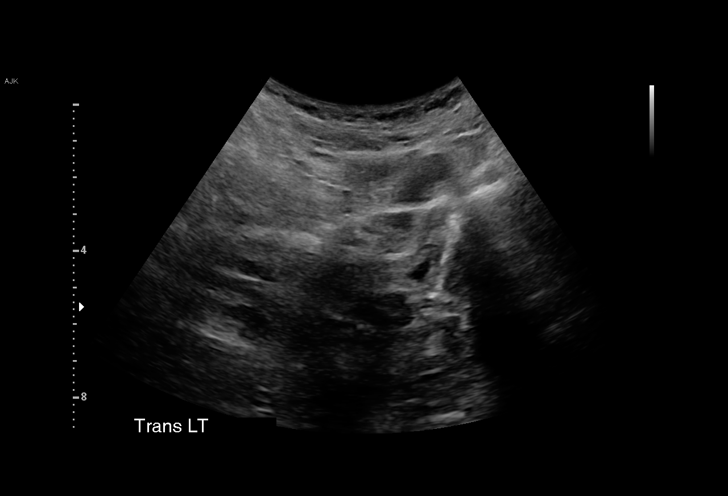
[im 26/34]
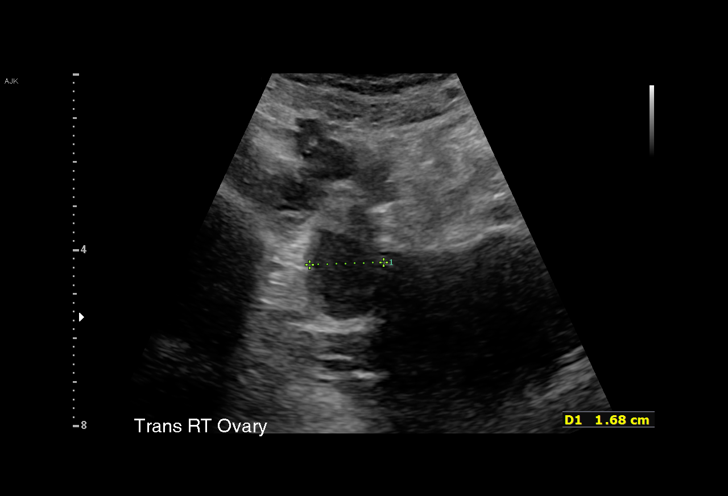
[im 29/34]
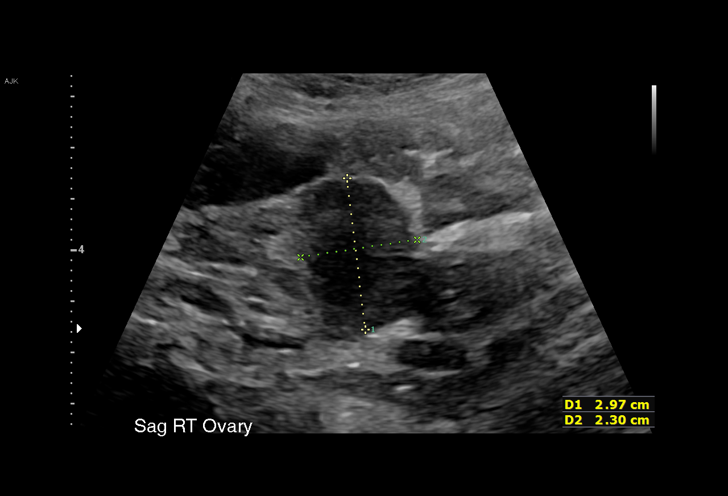
[im 31/34]
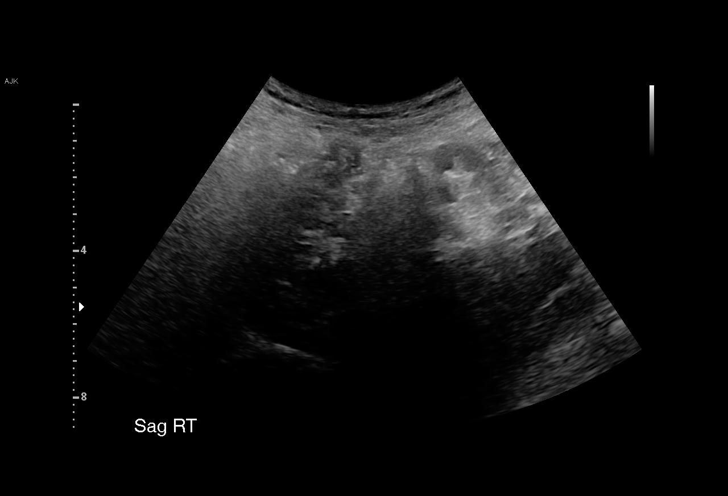
[im 34/34]
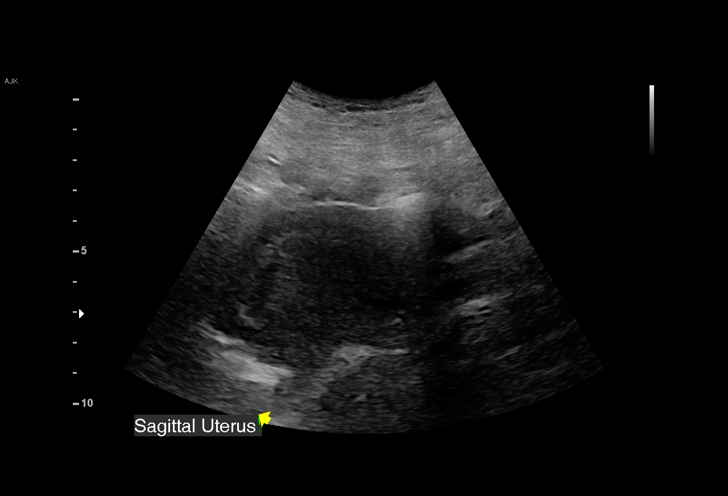

[15 of 28 positions shown; findings below may reference images not displayed]

FINDINGS: Intrauterine gestational sac: Present, single

Yolk sac:  Not visualized

Embryo:  Not visualized

Cardiac Activity: Not applicable

MSD:  8 mm   5 w   3 d

Subchorionic hemorrhage:  None visualized.

Maternal uterus/adnexae: The uterus is anteverted. No definite
intrauterine masses are seen. The cervix is not well visualized on
this examination. No free fluid within the cul-de-sac. The maternal
ovaries are unremarkable. Corpus luteum noted within the right
ovary.
IMPRESSION: Single intrauterine gestational sac identified without visualization
of the yolk sac. This roughly estimates the age of gestation between
5.0 and 5.5 weeks. Follow-up sonography in 14-21 days is recommended
to document appropriate progression and better age the gestation.

## 2021-09-01 NOTE — Progress Notes (Signed)
Erroneous encounter-disregard

## 2021-09-10 ENCOUNTER — Encounter: Payer: Medicaid Other | Admitting: Family

## 2021-09-10 DIAGNOSIS — Z7689 Persons encountering health services in other specified circumstances: Secondary | ICD-10-CM

## 2024-02-29 ENCOUNTER — Emergency Department (HOSPITAL_COMMUNITY)
Admission: EM | Admit: 2024-02-29 | Discharge: 2024-03-01 | Payer: MEDICAID | Attending: Emergency Medicine | Admitting: Emergency Medicine

## 2024-02-29 ENCOUNTER — Other Ambulatory Visit: Payer: Self-pay

## 2024-02-29 ENCOUNTER — Encounter (HOSPITAL_COMMUNITY): Payer: Self-pay | Admitting: *Deleted

## 2024-02-29 LAB — URINALYSIS, ROUTINE W REFLEX MICROSCOPIC
Bilirubin Urine: NEGATIVE
Glucose, UA: NEGATIVE mg/dL
Ketones, ur: NEGATIVE mg/dL
Nitrite: POSITIVE — AB
Protein, ur: 100 mg/dL — AB
Specific Gravity, Urine: 1.013 (ref 1.005–1.030)
WBC, UA: >50 WBC/hpf (ref 0–5)
pH: 7 (ref 5.0–8.0)

## 2024-02-29 LAB — BASIC METABOLIC PANEL WITH GFR
Anion gap: 12 (ref 5–15)
BUN: <5 mg/dL — ABNORMAL LOW (ref 6–20)
CO2: 22 mmol/L (ref 22–32)
Calcium: 9.2 mg/dL (ref 8.9–10.3)
Chloride: 103 mmol/L (ref 98–111)
Creatinine, Ser: 0.65 mg/dL (ref 0.44–1.00)
GFR, Estimated: >60 mL/min (ref >60)
Glucose, Bld: 86 mg/dL (ref 70–99)
Potassium: 3.2 mmol/L — ABNORMAL LOW (ref 3.5–5.1)
Sodium: 137 mmol/L (ref 135–145)

## 2024-02-29 LAB — CBC
HCT: 28.7 % — ABNORMAL LOW (ref 36.0–46.0)
Hemoglobin: 7.7 g/dL — ABNORMAL LOW (ref 12.0–15.0)
MCH: 18.6 pg — ABNORMAL LOW (ref 26.0–34.0)
MCHC: 26.8 g/dL — ABNORMAL LOW (ref 30.0–36.0)
MCV: 69.5 fL — ABNORMAL LOW (ref 80.0–100.0)
Platelets: 244 K/uL (ref 150–400)
RBC: 4.13 MIL/uL (ref 3.87–5.11)
RDW: 18.2 % — ABNORMAL HIGH (ref 11.5–15.5)
WBC: 8.9 K/uL (ref 4.0–10.5)
nRBC: 0 % (ref 0.0–0.2)

## 2024-02-29 LAB — I-STAT CHEM 8, ED
BUN: 4 mg/dL — ABNORMAL LOW (ref 6–20)
Calcium, Ion: 1.07 mmol/L — ABNORMAL LOW (ref 1.15–1.40)
Chloride: 105 mmol/L (ref 98–111)
Creatinine, Ser: 0.7 mg/dL (ref 0.44–1.00)
Glucose, Bld: 86 mg/dL (ref 70–99)
HCT: 29 % — ABNORMAL LOW (ref 36.0–46.0)
Hemoglobin: 9.9 g/dL — ABNORMAL LOW (ref 12.0–15.0)
Potassium: 3.3 mmol/L — ABNORMAL LOW (ref 3.5–5.1)
Sodium: 138 mmol/L (ref 135–145)
TCO2: 20 mmol/L — ABNORMAL LOW (ref 22–32)

## 2024-02-29 LAB — HCG, SERUM, QUALITATIVE: Preg, Serum: NEGATIVE

## 2024-02-29 MED ORDER — IBUPROFEN 800 MG PO TABS
800.0000 mg | ORAL_TABLET | Freq: Once | ORAL | Status: AC
  Administered 2024-02-29: 800 mg via ORAL
  Filled 2024-02-29: qty 1

## 2024-02-29 NOTE — ED Provider Triage Note (Signed)
 Emergency Medicine Provider Triage Evaluation Note  Samantha Randall , a 33 y.o. female  was evaluated in triage.  Pt complains of R flank pain and body aches. Patient believes it may be due to UTI. R flank is aching , associated chills and fever at home 100.6.  Review of Systems  Positive: Fl;ank pain  Negative: vomiting  Physical Exam  BP 127/72 (BP Location: Right Arm)   Pulse (!) 103   Temp 99.3 F (37.4 C) (Oral)   Resp 17   LMP 02/15/2024 (Approximate)   SpO2 100%  Gen:   Awake, no distress   Resp:  Normal effort  MSK:   Moves extremities without difficulty  Other:  No cva tenderness   Medical Decision Making  Medically screening exam initiated at 6:40 PM.  Appropriate orders placed.  Shenita L Batterman was informed that the remainder of the evaluation will be completed by another provider, this initial triage assessment does not replace that evaluation, and the importance of remaining in the ED until their evaluation is complete.     Arloa Chroman, PA-C 02/29/24 1842

## 2024-02-29 NOTE — ED Triage Notes (Signed)
 Pt has been having lower back pain with increased right flank pain and chills and lower lower abdominal pain.  No pain or burning with urination but pt reports foul swelling urine.

## 2024-03-01 NOTE — ED Notes (Signed)
 Called PT three times for vitals check and no answer.SABRASABRA

## 2024-03-01 NOTE — ED Notes (Signed)
Pt called X 3 for vitals recheck. Pt could not be found.  

## 2024-03-01 NOTE — ED Notes (Signed)
 PT was called once again for final call and no response... OTF

## 2024-03-02 ENCOUNTER — Encounter (HOSPITAL_COMMUNITY): Payer: Self-pay

## 2024-03-02 ENCOUNTER — Emergency Department (HOSPITAL_COMMUNITY): Payer: MEDICAID

## 2024-03-02 ENCOUNTER — Emergency Department (HOSPITAL_COMMUNITY)
Admission: EM | Admit: 2024-03-02 | Discharge: 2024-03-02 | Disposition: A | Discharge status: Home / Self Care | Payer: MEDICAID | Attending: Emergency Medicine | Admitting: Emergency Medicine

## 2024-03-02 ENCOUNTER — Other Ambulatory Visit: Payer: Self-pay

## 2024-03-02 DIAGNOSIS — R509 Fever, unspecified: Secondary | ICD-10-CM | POA: Diagnosis present

## 2024-03-02 DIAGNOSIS — N12 Tubulo-interstitial nephritis, not specified as acute or chronic: Secondary | ICD-10-CM | POA: Insufficient documentation

## 2024-03-02 LAB — COMPREHENSIVE METABOLIC PANEL WITH GFR
ALT: 9 U/L (ref 0–44)
AST: 19 U/L (ref 15–41)
Albumin: 4.9 g/dL (ref 3.5–5.0)
Alkaline Phosphatase: 85 U/L (ref 38–126)
Anion gap: 17 — ABNORMAL HIGH (ref 5–15)
BUN: 14 mg/dL (ref 6–20)
CO2: 20 mmol/L — ABNORMAL LOW (ref 22–32)
Calcium: 10.1 mg/dL (ref 8.9–10.3)
Chloride: 99 mmol/L (ref 98–111)
Creatinine, Ser: 1.01 mg/dL — ABNORMAL HIGH (ref 0.44–1.00)
GFR, Estimated: >60 mL/min (ref >60)
Glucose, Bld: 104 mg/dL — ABNORMAL HIGH (ref 70–99)
Potassium: 3.5 mmol/L (ref 3.5–5.1)
Sodium: 136 mmol/L (ref 135–145)
Total Bilirubin: 0.6 mg/dL (ref 0.0–1.2)
Total Protein: 8.9 g/dL — ABNORMAL HIGH (ref 6.5–8.1)

## 2024-03-02 LAB — CBC WITH DIFFERENTIAL/PLATELET
Abs Immature Granulocytes: 0.22 K/uL — ABNORMAL HIGH (ref 0.00–0.07)
Basophils Absolute: 0 K/uL (ref 0.0–0.1)
Basophils Relative: 0 %
Eosinophils Absolute: 0 K/uL (ref 0.0–0.5)
Eosinophils Relative: 0 %
HCT: 28 % — ABNORMAL LOW (ref 36.0–46.0)
Hemoglobin: 7.8 g/dL — ABNORMAL LOW (ref 12.0–15.0)
Immature Granulocytes: 1 %
Lymphocytes Relative: 12 %
Lymphs Abs: 2.3 K/uL (ref 0.7–4.0)
MCH: 18.8 pg — ABNORMAL LOW (ref 26.0–34.0)
MCHC: 27.9 g/dL — ABNORMAL LOW (ref 30.0–36.0)
MCV: 67.5 fL — ABNORMAL LOW (ref 80.0–100.0)
Monocytes Absolute: 1.4 K/uL — ABNORMAL HIGH (ref 0.1–1.0)
Monocytes Relative: 7 %
Neutro Abs: 15.8 K/uL — ABNORMAL HIGH (ref 1.7–7.7)
Neutrophils Relative %: 80 %
Platelets: 238 K/uL (ref 150–400)
RBC: 4.15 MIL/uL (ref 3.87–5.11)
RDW: 18.3 % — ABNORMAL HIGH (ref 11.5–15.5)
Smear Review: NORMAL
WBC: 19.8 K/uL — ABNORMAL HIGH (ref 4.0–10.5)
nRBC: 0 % (ref 0.0–0.2)

## 2024-03-02 LAB — RESP PANEL BY RT-PCR (RSV, FLU A&B, COVID)  RVPGX2
Influenza A by PCR: NEGATIVE
Influenza B by PCR: NEGATIVE
Resp Syncytial Virus by PCR: NEGATIVE
SARS Coronavirus 2 by RT PCR: NEGATIVE

## 2024-03-02 LAB — URINALYSIS, ROUTINE W REFLEX MICROSCOPIC
Bilirubin Urine: NEGATIVE
Glucose, UA: NEGATIVE mg/dL
Ketones, ur: 5 mg/dL — AB
Nitrite: POSITIVE — AB
Protein, ur: >=300 mg/dL — AB
Specific Gravity, Urine: 1.016 (ref 1.005–1.030)
WBC, UA: >50 WBC/hpf (ref 0–5)
pH: 5 (ref 5.0–8.0)

## 2024-03-02 LAB — I-STAT CG4 LACTIC ACID, ED: Lactic Acid, Venous: 1.2 mmol/L (ref 0.5–1.9)

## 2024-03-02 LAB — PREGNANCY, URINE: Preg Test, Ur: NEGATIVE

## 2024-03-02 MED ORDER — KETOROLAC TROMETHAMINE 15 MG/ML IJ SOLN
15.0000 mg | Freq: Once | INTRAMUSCULAR | Status: AC
  Administered 2024-03-02: 15 mg via INTRAVENOUS
  Filled 2024-03-02: qty 1

## 2024-03-02 MED ORDER — LACTATED RINGERS IV SOLN: INTRAVENOUS | Status: DC

## 2024-03-02 MED ORDER — IOHEXOL 300 MG/ML  SOLN
100.0000 mL | Freq: Once | INTRAMUSCULAR | Status: AC | PRN
  Administered 2024-03-02: 100 mL via INTRAVENOUS

## 2024-03-02 MED ORDER — SODIUM CHLORIDE 0.9 % IV SOLN
1.0000 g | Freq: Once | INTRAVENOUS | Status: AC
  Administered 2024-03-02: 1 g via INTRAVENOUS
  Filled 2024-03-02: qty 10

## 2024-03-02 MED ORDER — LACTATED RINGERS IV BOLUS
1000.0000 mL | Freq: Once | INTRAVENOUS | Status: AC
  Administered 2024-03-02: 1000 mL via INTRAVENOUS

## 2024-03-02 MED ORDER — ACETAMINOPHEN 500 MG PO TABS
1000.0000 mg | ORAL_TABLET | Freq: Once | ORAL | Status: AC
  Administered 2024-03-02: 1000 mg via ORAL
  Filled 2024-03-02: qty 2

## 2024-03-02 NOTE — ED Notes (Signed)
Pt passed her PO challenge.

## 2024-03-02 NOTE — ED Provider Notes (Signed)
 [place] EMERGENCY DEPARTMENT AT [facility] Provider Note   CSN: [id number] Arrival date & time: 03/02/24  1334     Patient presents with: Influenza   Samantha Randall is a 33 y.o. female with history of anemia presents with complaints of subjective fevers, chills, generalized myalgias and headache over the past 3 days.  Has had some associated flank and abdominal pain that has since resolved.  Has had some nausea without vomiting or diarrhea.  No cough or sore throat.  Denies any dysuria or increased frequency.  Presented a few nights ago to [facility] however left without being seen.    Influenza Presenting symptoms: fever       Past Medical History:  Diagnosis Date   Anemia    No pertinent past medical history    Past Surgical History:  Procedure Laterality Date   NO PAST SURGERIES     WISDOM TOOTH EXTRACTION       Prior to Admission medications  Medication Sig Start Date End Date Taking? Authorizing Provider  ciprofloxacin (CIPRO) 500 MG tablet Take 1 tablet (500 mg total) by mouth every 12 (twelve) hours. 03/02/24  Yes Donnajean Lynwood DEL, PA-C  cefadroxil  (DURICEF) 500 MG capsule Take 1 capsule (500 mg total) by mouth 2 (two) times daily. 04/16/20   Walker, Jamilla R, CNM  Prenat w/o A Vit-FeFum-FePo-FA (CONCEPT OB ) 130-92.4-1 MG CAPS Take 1 capsule by mouth daily. 01/08/17   Janit Alm Lynwood, MD  ranitidine  (ZANTAC ) 150 MG tablet Take 1 tablet (150 mg total) 2 (two) times daily by mouth. 02/02/17   Connell Davies, MD    Allergies: Ibuprofen     Review of Systems  Constitutional:  Positive for fever.    Updated Vital Signs BP 111/71 (BP Location: Left Arm)   Pulse 94   Temp 99.2 F (37.3 C) (Oral)   Resp 18   LMP 02/25/2024 (Approximate)   SpO2 100%   Physical Exam Vitals and nursing note reviewed.  Constitutional:      General: She is not in acute distress.    Appearance: She is well-developed.  HENT:     Head: Normocephalic and  atraumatic.  Eyes:     Conjunctiva/sclera: Conjunctivae normal.  Cardiovascular:     Rate and Rhythm: Normal rate and regular rhythm.     Heart sounds: No murmur heard. Pulmonary:     Effort: Pulmonary effort is normal. No respiratory distress.     Breath sounds: Normal breath sounds.  Abdominal:     Palpations: Abdomen is soft.     Tenderness: There is no abdominal tenderness. There is right CVA tenderness.  Musculoskeletal:        General: No swelling.     Cervical back: Neck supple.  Skin:    General: Skin is warm and dry.     Capillary Refill: Capillary refill takes less than 2 seconds.  Neurological:     Mental Status: She is alert.  Psychiatric:        Mood and Affect: Mood normal.     (all labs ordered are listed, but only abnormal results are displayed) Labs Reviewed  CBC WITH DIFFERENTIAL/PLATELET - Abnormal; Notable for the following components:      Result Value   WBC 19.8 (*)    Hemoglobin 7.8 (*)    HCT 28.0 (*)    MCV 67.5 (*)    MCH 18.8 (*)    MCHC 27.9 (*)    RDW 18.3 (*)    Neutro  Abs 15.8 (*)    Monocytes Absolute 1.4 (*)    Abs Immature Granulocytes 0.22 (*)    All other components within normal limits  COMPREHENSIVE METABOLIC PANEL WITH GFR - Abnormal; Notable for the following components:   CO2 20 (*)    Glucose, Bld 104 (*)    Creatinine, Ser 1.01 (*)    Total Protein 8.9 (*)    Anion gap 17 (*)    All other components within normal limits  URINALYSIS, ROUTINE W REFLEX MICROSCOPIC - Abnormal; Notable for the following components:   Color, Urine AMBER (*)    APPearance CLOUDY (*)    Hgb urine dipstick SMALL (*)    Ketones, ur 5 (*)    Protein, ur >=300 (*)    Nitrite POSITIVE (*)    Leukocytes,Ua LARGE (*)    Bacteria, UA MANY (*)    All other components within normal limits  RESP PANEL BY RT-PCR (RSV, FLU A&B, COVID)  RVPGX2  CULTURE, BLOOD (ROUTINE X 2)  CULTURE, BLOOD (ROUTINE X 2)  PREGNANCY, URINE  I-STAT CG4 LACTIC ACID, ED   I-STAT CG4 LACTIC ACID, ED    EKG: None  Radiology: CT ABDOMEN PELVIS W CONTRAST Result Date: 03/02/2024 CLINICAL DATA:  Midline pain radiating to flank, urinary tract infection EXAM: CT ABDOMEN AND PELVIS WITH CONTRAST TECHNIQUE: Multidetector CT imaging of the abdomen and pelvis was performed using the standard protocol following bolus administration of intravenous contrast. RADIATION DOSE REDUCTION: This exam was performed according to the departmental dose-optimization program which includes automated exposure control, adjustment of the mA and/or kV according to patient size and/or use of iterative reconstruction technique. CONTRAST:  OMNIPAQUE IOHEXOL 300 MG/ML  SOLN COMPARISON:  07/29/2011 FINDINGS: Lower chest: No acute pleural or parenchymal lung disease. Hepatobiliary: Hepatic steatosis. No focal liver abnormality. The gallbladder is unremarkable. Pancreas: Unremarkable. No pancreatic ductal dilatation or surrounding inflammatory changes. Spleen: Normal in size without focal abnormality. Adrenals/Urinary Tract: Heterogeneous areas of decreased cortical enhancement with a striated appearance are seen within the mid to lower pole the right kidney, consistent with pyelonephritis. No evidence of renal abscess. Left kidney enhances normally. No urinary tract calculi or obstructive uropathy within either kidney. The adrenals are normal. The bladder is decompressed, limiting its evaluation. Stomach/Bowel: No bowel obstruction or ileus. No bowel wall thickening or inflammatory change. Vascular/Lymphatic: No significant vascular findings are present. No enlarged abdominal or pelvic lymph nodes. Reproductive: Uterus and bilateral adnexa are unremarkable. Other: No free fluid or free intraperitoneal gas. Small fat containing periumbilical ventral hernia. No bowel herniation. Musculoskeletal: No acute or destructive bony abnormalities. Reconstructed images demonstrate no additional findings.  IMPRESSION: 1. Heterogeneous decreased cortical enhancement within the mid to lower pole right kidney, consistent with pyelonephritis. No evidence of renal abscess. 2. Hepatic steatosis. 3. Small fat containing periumbilical ventral hernia. Electronically Signed   By: Ozell Daring M.D.   On: 03/02/2024 16:53     Procedures   Medications Ordered in the ED  acetaminophen  (TYLENOL ) tablet 1,000 mg (1,000 mg Oral Given 03/02/24 1502)  lactated ringers  bolus 1,000 mL (0 mLs Intravenous Stopped 03/02/24 1630)  cefTRIAXone  (ROCEPHIN ) 1 g in sodium chloride  0.9 % 100 mL IVPB (0 g Intravenous Stopped 03/02/24 1622)  iohexol (OMNIPAQUE) 300 MG/ML solution 100 mL (100 mLs Intravenous Contrast Given 03/02/24 1612)  lactated ringers  bolus 1,000 mL (1,000 mLs Intravenous New Bag/Given 03/02/24 1742)  ketorolac  (TORADOL ) 15 MG/ML injection 15 mg (15 mg Intravenous Given 03/02/24 1819)  Clinical Course as of 03/02/24 1843  Thu Mar 02, 2024  1456 Patient evaluated for myalgias with subjective fevers and chills over the past few days without any other associated URI symptoms.  Initially had some abdominal and flank pain that has since resolved.  Left without being seen the other night however her lab work was notable for UTI and hemoglobin is 7.7.  Upon arrival today patient is tachycardic to 127.  Temperature 99.2.  Otherwise hemodynamically stable.  Will repeat lab work again today. [JT]  1630 Comprehensive metabolic panel(!) Anion gap of 17, bicarb of 20, glucose within normal limits [JT]  1630 CBC with Differential(!) Leukocytosis of 19.8, hemoglobin baseline 7.8, 7.72 days ago [JT]  1631 I-Stat CG4 Lactic Acid Within normal limits [JT]  1631 Urinalysis, Routine w reflex microscopic -Urine, Clean Catch(!) Positive nitrites with large amount leukocytes greater than 50 WBCs many bacteria, consistent with UTI [JT]  1726 CT ABDOMEN PELVIS W CONTRAST Consistent with pyelonephritis [JT]  1840 Patient  tolerating p.o.  Pain is improved.  She is hemodynamically stable and amenable to discharge.  We discharged home with strict return precautions and close PCP follow-up.  Patient is understanding agreement with plan. [JT]    Clinical Course User Index [JT] Donnajean Lynwood DEL, PA-C                                 Medical Decision Making Amount and/or Complexity of Data Reviewed Labs: ordered. Decision-making details documented in ED Course. Radiology: ordered. Decision-making details documented in ED Course.  Risk OTC drugs. Prescription drug management.   This patient presents to the ED with chief complaint(s) of myalgias.  The complaint involves an extensive differential diagnosis and also carries with it a high risk of complications and morbidity.   Pertinent past medical history as listed in HPI  The differential diagnosis includes  UTI, pyelonephritis, URI, pneumonia Additional history obtained: Records reviewed Care Everywhere/External Records  Disposition:   Patient will be discharged home. The patient has been appropriately medically screened and/or stabilized in the ED. I have low suspicion for any other emergent medical condition which would require further screening, evaluation or treatment in the ED or require inpatient management. At time of discharge the patient is hemodynamically stable and in no acute distress. I have discussed work-up results and diagnosis with patient and answered all questions. Patient is agreeable with discharge plan. We discussed strict return precautions for returning to the emergency department and they verbalized understanding.     Social Determinants of Health:   none  This note was dictated with voice recognition software.  Despite best efforts at proofreading, errors may have occurred which can change the documentation meaning.       Final diagnoses:  Pyelonephritis    ED Discharge Orders          Ordered    ciprofloxacin (CIPRO)  500 MG tablet  Every 12 hours        03/02/24 1841               Donnajean Lynwood DEL, PA-C 03/02/24 1843    Samantha Jayson LABOR, DO 03/03/24 [phone]

## 2024-03-02 NOTE — ED Notes (Signed)
 ..  The patient is A&OX4, ambulatory at d/c with independent steady gait, NAD. Pt verbalized understanding of d/c instructions, prescription and follow up care.

## 2024-03-02 NOTE — Discharge Instructions (Signed)
 You were evaluated in the emergency room for fever.  You are found to have a urinary tract infection that has traveled up to your kidney.  Prescription for antibiotics have been sent into your pharmacy.  Please be sure to complete the full course of antibiotics.  I would recommend follow-up with your PCP to ensure your symptoms are improving.  If you experience any new or worsening symptoms including worsening pain and persistent fevers please return to the emergency room.

## 2024-03-02 NOTE — ED Notes (Signed)
 Gave pt a cup of water per RN's request for the PO Challenge.

## 2024-03-02 NOTE — ED Triage Notes (Signed)
 Pt came from home has been having body aches, fever, headache, and chills for the past 3 days. Pt reports nausea, but no diarrhea or vomiting.

## 2024-03-07 LAB — CULTURE, BLOOD (ROUTINE X 2): Culture: NO GROWTH

## 2024-03-13 ENCOUNTER — Ambulatory Visit (HOSPITAL_COMMUNITY)
Admission: EM | Admit: 2024-03-13 | Discharge: 2024-03-13 | Disposition: A | Discharge status: Home / Self Care | Payer: MEDICAID

## 2024-03-13 DIAGNOSIS — Z560 Unemployment, unspecified: Secondary | ICD-10-CM | POA: Insufficient documentation

## 2024-03-13 DIAGNOSIS — F4323 Adjustment disorder with mixed anxiety and depressed mood: Secondary | ICD-10-CM | POA: Insufficient documentation

## 2024-03-13 DIAGNOSIS — Z5901 Sheltered homelessness: Secondary | ICD-10-CM | POA: Insufficient documentation

## 2024-03-13 DIAGNOSIS — Z0271 Encounter for disability determination: Secondary | ICD-10-CM | POA: Insufficient documentation
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
## 2024-03-13 NOTE — Discharge Instructions (Addendum)
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
